# Patient Record
Sex: Male | Born: 1940 | Race: White | Hispanic: No | Smoking: Never smoker
Health system: Southern US, Community
[De-identification: ages and names within clinical notes are randomized; demographics above are authoritative.]

## PROBLEM LIST (undated history)

## (undated) DIAGNOSIS — I4891 Unspecified atrial fibrillation: Secondary | ICD-10-CM

## (undated) DIAGNOSIS — I639 Cerebral infarction, unspecified: Secondary | ICD-10-CM

## (undated) DIAGNOSIS — F0390 Unspecified dementia without behavioral disturbance: Secondary | ICD-10-CM

## (undated) DIAGNOSIS — I10 Essential (primary) hypertension: Secondary | ICD-10-CM

## (undated) DIAGNOSIS — K219 Gastro-esophageal reflux disease without esophagitis: Secondary | ICD-10-CM

## (undated) DIAGNOSIS — I499 Cardiac arrhythmia, unspecified: Secondary | ICD-10-CM

## (undated) DIAGNOSIS — E669 Obesity, unspecified: Secondary | ICD-10-CM

## (undated) DIAGNOSIS — E7800 Pure hypercholesterolemia, unspecified: Secondary | ICD-10-CM

## (undated) DIAGNOSIS — G4730 Sleep apnea, unspecified: Secondary | ICD-10-CM

## (undated) HISTORY — DX: Pure hypercholesterolemia, unspecified: E78.00

## (undated) HISTORY — PX: CATARACT EXTRACTION: SUR2

## (undated) HISTORY — PX: BRAIN SURGERY: SHX531

## (undated) HISTORY — DX: Essential (primary) hypertension: I10

## (undated) HISTORY — DX: Obesity, unspecified: E66.9

## (undated) HISTORY — DX: Sleep apnea, unspecified: G47.30

## (undated) HISTORY — DX: Cardiac arrhythmia, unspecified: I49.9

---

## 2009-07-25 ENCOUNTER — Encounter: Admission: RE | Admit: 2009-07-25 | Discharge: 2009-07-25 | Payer: Self-pay | Admitting: Cardiology

## 2009-08-01 ENCOUNTER — Inpatient Hospital Stay (HOSPITAL_BASED_OUTPATIENT_CLINIC_OR_DEPARTMENT_OTHER): Admission: RE | Admit: 2009-08-01 | Discharge: 2009-08-01 | Payer: Self-pay | Admitting: Cardiology

## 2009-08-15 ENCOUNTER — Encounter: Admission: RE | Admit: 2009-08-15 | Discharge: 2009-08-15 | Payer: Self-pay | Admitting: Cardiology

## 2010-09-04 ENCOUNTER — Other Ambulatory Visit: Payer: Self-pay | Admitting: Cardiology

## 2010-09-07 ENCOUNTER — Other Ambulatory Visit: Payer: Self-pay | Admitting: Cardiology

## 2010-09-07 DIAGNOSIS — I723 Aneurysm of iliac artery: Secondary | ICD-10-CM

## 2010-09-11 ENCOUNTER — Other Ambulatory Visit (INDEPENDENT_AMBULATORY_CARE_PROVIDER_SITE_OTHER): Payer: No Typology Code available for payment source

## 2010-09-11 DIAGNOSIS — E785 Hyperlipidemia, unspecified: Secondary | ICD-10-CM

## 2010-09-11 DIAGNOSIS — Z79899 Other long term (current) drug therapy: Secondary | ICD-10-CM

## 2010-09-11 DIAGNOSIS — I10 Essential (primary) hypertension: Secondary | ICD-10-CM

## 2010-09-12 ENCOUNTER — Ambulatory Visit
Admission: RE | Admit: 2010-09-12 | Discharge: 2010-09-12 | Disposition: A | Discharge status: Home / Self Care | Payer: No Typology Code available for payment source | Source: Ambulatory Visit | Attending: Cardiology | Admitting: Cardiology

## 2010-09-12 DIAGNOSIS — I723 Aneurysm of iliac artery: Secondary | ICD-10-CM

## 2010-09-12 MED ORDER — IOHEXOL 300 MG/ML  SOLN
125.0000 mL | Freq: Once | INTRAMUSCULAR | Status: AC | PRN
  Administered 2010-09-12: 125 mL via INTRAVENOUS

## 2011-11-06 ENCOUNTER — Encounter: Payer: Self-pay | Admitting: *Deleted

## 2013-12-06 ENCOUNTER — Encounter: Payer: Self-pay | Admitting: Cardiology

## 2014-03-07 DIAGNOSIS — E785 Hyperlipidemia, unspecified: Secondary | ICD-10-CM | POA: Insufficient documentation

## 2014-03-07 DIAGNOSIS — I10 Essential (primary) hypertension: Secondary | ICD-10-CM | POA: Insufficient documentation

## 2014-03-07 DIAGNOSIS — K219 Gastro-esophageal reflux disease without esophagitis: Secondary | ICD-10-CM | POA: Insufficient documentation

## 2016-03-20 ENCOUNTER — Emergency Department
Admission: EM | Admit: 2016-03-20 | Discharge: 2016-03-21 | Disposition: A | Discharge status: Home / Self Care | Payer: Medicare Other | Attending: Emergency Medicine | Admitting: Emergency Medicine

## 2016-03-20 ENCOUNTER — Emergency Department: Payer: Medicare Other

## 2016-03-20 DIAGNOSIS — E869 Volume depletion, unspecified: Secondary | ICD-10-CM | POA: Diagnosis not present

## 2016-03-20 DIAGNOSIS — I671 Cerebral aneurysm, nonruptured: Secondary | ICD-10-CM | POA: Diagnosis not present

## 2016-03-20 DIAGNOSIS — I729 Aneurysm of unspecified site: Secondary | ICD-10-CM

## 2016-03-20 DIAGNOSIS — I10 Essential (primary) hypertension: Secondary | ICD-10-CM | POA: Diagnosis not present

## 2016-03-20 DIAGNOSIS — R42 Dizziness and giddiness: Secondary | ICD-10-CM | POA: Diagnosis present

## 2016-03-20 LAB — URINALYSIS COMPLETE WITH MICROSCOPIC (ARMC ONLY)
Bacteria, UA: NONE SEEN
Bilirubin Urine: NEGATIVE
GLUCOSE, UA: NEGATIVE mg/dL
Hgb urine dipstick: NEGATIVE
KETONES UR: NEGATIVE mg/dL
Leukocytes, UA: NEGATIVE
Nitrite: NEGATIVE
PROTEIN: 100 mg/dL — AB
SPECIFIC GRAVITY, URINE: 1.021 (ref 1.005–1.030)
pH: 6 (ref 5.0–8.0)

## 2016-03-20 LAB — CBC
HCT: 43.5 % (ref 40.0–52.0)
Hemoglobin: 14.9 g/dL (ref 13.0–18.0)
MCH: 30.7 pg (ref 26.0–34.0)
MCHC: 34.2 g/dL (ref 32.0–36.0)
MCV: 89.9 fL (ref 80.0–100.0)
PLATELETS: 161 10*3/uL (ref 150–440)
RBC: 4.84 MIL/uL (ref 4.40–5.90)
RDW: 13.8 % (ref 11.5–14.5)
WBC: 7.2 10*3/uL (ref 3.8–10.6)

## 2016-03-20 LAB — BASIC METABOLIC PANEL
Anion gap: 8 (ref 5–15)
BUN: 27 mg/dL — AB (ref 6–20)
CALCIUM: 9.1 mg/dL (ref 8.9–10.3)
CHLORIDE: 104 mmol/L (ref 101–111)
CO2: 27 mmol/L (ref 22–32)
CREATININE: 1.42 mg/dL — AB (ref 0.61–1.24)
GFR calc non Af Amer: 47 mL/min — ABNORMAL LOW (ref >60)
GFR, EST AFRICAN AMERICAN: 54 mL/min — AB (ref >60)
GLUCOSE: 111 mg/dL — AB (ref 65–99)
Potassium: 4.5 mmol/L (ref 3.5–5.1)
Sodium: 139 mmol/L (ref 135–145)

## 2016-03-20 LAB — TROPONIN I: Troponin I: <0.03 ng/mL (ref <0.03)

## 2016-03-20 LAB — CK: Total CK: 95 U/L (ref 49–397)

## 2016-03-20 MED ORDER — IOPAMIDOL (ISOVUE-370) INJECTION 76%
75.0000 mL | Freq: Once | INTRAVENOUS | Status: AC | PRN
  Administered 2016-03-20: 75 mL via INTRAVENOUS

## 2016-03-20 MED ORDER — SODIUM CHLORIDE 0.9 % IV BOLUS (SEPSIS)
1000.0000 mL | Freq: Once | INTRAVENOUS | Status: AC
  Administered 2016-03-20: 1000 mL via INTRAVENOUS

## 2016-03-20 NOTE — ED Provider Notes (Signed)
[facility] Emergency Department Provider Note  ____________________________________________   First MD Initiated Contact with Patient 03/20/16 2012     (approximate)  I have reviewed the triage vital signs and the nursing notes.   HISTORY  Chief Complaint Dizziness; Blurred Vision; and Nausea    HPI Jose Baker is a 75 y.o. male who is generally healthy and active for his age and presents for evaluation of lightheadedness, some double vision, and nausea that occurred earlier today acutely after he had been working outside all day.  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  By the end of the afternoon he felt dizzy when he would turn his head to the sidesand he felt lightheaded when ambulating.  He also states that he was seeing double when he was looking at the gears and levers on his tractor.  All that has since resolved.  He denies fever/chills, chest pain, shortness of breath, and, abdominal pain.  He endorses a normal amount of urination.  He had nausea but no vomiting.  He states that his symptoms are moderate but now resolved.  Nothing in particular made it better nor worse.   Past Medical History:  Diagnosis Date  . Arrhythmia   . HTN (hypertension)   . Hypercholesteremia   . Obesity   . Sleep apnea     There are no active problems to display for this patient.   No past surgical history on file.  Prior to Admission medications   Medication Sig Start Date End Date Taking? Authorizing Provider  b complex vitamins tablet Take 1 tablet by mouth daily.    Historical Provider, MD  Calcium Carbonate-Vitamin D (CALCIUM 500 + D PO) Take by mouth daily.    Historical Provider, MD  Cholecalciferol (VITAMIN D3) 2000 UNITS TABS Take by mouth daily.    Historical Provider, MD  fish oil-omega-3 fatty acids 1000 MG capsule Take 2 g by mouth daily.    Historical  Provider, MD  Selenium 200 MCG CAPS Take by mouth daily.    Historical Provider, MD  verapamil (CALAN-SR) 180 MG CR tablet Take 180 mg by mouth at bedtime.    Historical Provider, MD    Allergies Lipitor [atorvastatin calcium]  Family History  Problem Relation Age of Onset  . Alzheimer's disease    . Hypertension      Social History Social History  Substance Use Topics  . Smoking status: Never Smoker  . Smokeless tobacco: Not on file  . Alcohol use No    Review of Systems Constitutional: No fever/chills Eyes: Transient double vision ENT: No sore throat. Cardiovascular: Denies chest pain. Respiratory: Denies shortness of breath. Gastrointestinal: No abdominal pain.  nausea, no vomiting.  No diarrhea.  No constipation. Genitourinary: Negative for dysuria. Musculoskeletal: Negative for back pain. Skin: Negative for rash. Neurological: Negative for headaches, focal weakness or numbness.  Lightheadedness and dizziness after working outside, now resolved  10-point ROS otherwise negative.  ____________________________________________   PHYSICAL EXAM:  VITAL SIGNS: ED Triage Vitals  Enc Vitals Group     BP 03/20/16 1830 (!) 153/89     Pulse Rate 03/20/16 1830 74     Resp 03/20/16 1830 18     Temp 03/20/16 1830 98 F (36.7 C)     Temp Source 03/20/16 1830 Oral     SpO2 03/20/16 1830 95 %  Weight 03/20/16 1831 220 lb (99.8 kg)     Height 03/20/16 1831 5\' 7"  (1.702 m)     Head Circumference --      Peak Flow --      Pain Score 03/20/16 1832 0     Pain Loc --      Pain Edu? --      Excl. in GC? --     Constitutional: Alert and oriented. Well appearing and in no acute distress. Eyes: Conjunctivae are normal. PERRL. EOMI. No nystagmus or reproducible double vision with ocular range of motion  Head: Atraumatic. Nose: No congestion/rhinnorhea. Mouth/Throat: Mucous membranes are moist.  Oropharynx non-erythematous. Neck: No stridor.  No meningeal signs.     Cardiovascular: Normal rate, regular rhythm. Good peripheral circulation. Grossly normal heart sounds. Respiratory: Normal respiratory effort.  No retractions. Lungs CTAB. Gastrointestinal: Soft and nontender. No distention.  Musculoskeletal: No lower extremity tenderness nor edema. No gross deformities of extremities. Neurologic:  Normal speech and language. No gross focal neurologic deficits are appreciated.  Skin:  Skin is warm, dry and intact. No rash noted. Psychiatric: Mood and affect are normal. Speech and behavior are normal.  ____________________________________________   LABS (all labs ordered are listed, but only abnormal results are displayed)  Labs Reviewed  BASIC METABOLIC PANEL - Abnormal; Notable for the following:       Result Value   Glucose, Bld 111 (*)    BUN 27 (*)    Creatinine, Ser 1.42 (*)    GFR calc non Af Amer 47 (*)    GFR calc Af Amer 54 (*)    All other components within normal limits  URINALYSIS COMPLETEWITH MICROSCOPIC ([facility] ONLY) - Abnormal; Notable for the following:    Color, Urine YELLOW (*)    APPearance CLEAR (*)    Protein, ur 100 (*)    Squamous Epithelial / LPF 0-5 (*)    All other components within normal limits  CBC  TROPONIN I  CK  CBG MONITORING, ED   ____________________________________________  EKG  ED ECG REPORT I, Olean Sangster, the attending physician, personally viewed and interpreted this ECG.  Date: 03/20/2016 EKG Time: 19:16 Rate: 55 Rhythm: Sinus bradycardia with sinus arrhythmia and first-degree heart block QRS Axis: normal Intervals: First-degree heart block with PR interval of 272 ms ST/T Wave abnormalities: normal Conduction Disturbances: none Narrative Interpretation: unremarkable  ____________________________________________  RADIOLOGY   Ct Angio Head W Or Wo Contrast  Result Date: 03/20/2016 CLINICAL DATA:  75 y/o M; dizziness and double vision. Question of aneurysm on CT. EXAM: CT ANGIOGRAPHY  HEAD TECHNIQUE: Multidetector CT imaging of the head was performed using the standard protocol during bolus administration of intravenous contrast. Multiplanar CT image reconstructions and MIPs were obtained to evaluate the vascular anatomy. CONTRAST:  75 cc Isovue 370 COMPARISON:  CT head 03/20/2016 FINDINGS: CTA HEAD Anterior circulation: Internal carotid arteries, middle cerebral arteries, and anterior cerebral arteries are patent. Mild calcific atherosclerosis of cavernous internal carotid arteries. No right A1 segment enlarged left A1 segment anterior communicating artery, normal variant. 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 Large patent bilateral posterior communicating arteries. Posterior circulation: Left dominant vertebrobasilar system. The right vertebral artery largely terminates in right plica. Atherosclerosis of the left V4 segment with areas of irregularity and mild stenosis. Patent small caliber basilar artery. Diminutive bilateral P1 segments. Patent bilateral posterior cerebral arteries. Venous sinuses: Poor  contrast opacification of the dural venous sinuses. Anatomic variants: Bilateral fetal posterior cerebral arteries. No right A1 segment with large left A1 segment providing circulation to bilateral ACA. IMPRESSION: 1. Anatomic variant with no right A1 segment enlarged left A1 segment providing circulation to bilateral A2 and distal ACA. 2. 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 3. Intracranial atherosclerosis without high-grade stenosis. No large vessel occlusion. No other aneurysm is identified. These results will be called to the ordering clinician or representative by the Radiologist Assistant, and communication documented in the PACS or zVision Dashboard. Electronically Signed   By: Mitzi HansenLance  Furusawa-Stratton M.D.   On: 03/20/2016 22:12   Ct Head Wo Contrast  Result  Date: 03/20/2016 CLINICAL DATA:  Dizziness and double vision after working outside all day today. Symptoms improved. EXAM: CT HEAD WITHOUT CONTRAST TECHNIQUE: Contiguous axial images were obtained from the base of the skull through the vertex without intravenous contrast. COMPARISON:  None. FINDINGS: Brain: Ventricles, cisterns and other CSF spaces are within normal. There is no mass, mass effect, shift of midline structures or acute hemorrhage. No evidence of acute infarction. Vascular: Possible aneurysm over the right side of the circle Willis in the region of the A1 segment or anterior communicating artery. Skull: Within normal. Sinuses/Orbits: Within normal. IMPRESSION: No acute intracranial findings. Possible aneurysm over the right side of the circle Willis. Recommend CTA of the head for further evaluation. Electronically Signed   By: Elberta Fortisaniel  Boyle M.D.   On: 03/20/2016 19:59    ____________________________________________   PROCEDURES  Procedure(s) performed:   Procedures   Critical Care performed: No ____________________________________________   INITIAL IMPRESSION / ASSESSMENT AND PLAN / ED COURSE  Pertinent labs & imaging results that were available during my care of the patient were reviewed by me and considered in my medical decision making (see chart for details).  Patient is well-appearing and in no acute distress. Non-con CT notable for possible aneurysm.  Doubt this has to do with the current symptoms, but radiology recommends we further evaluate it.  Discussed with patient and spouse who do want to pursue imaging.  Will order CTA as recommended.   (Note that documentation was delayed due to multiple ED patients requiring immediate care.)  9x8x589mm aneurysm on CTA.  Discussed outpatient options with patient, prefers [facility].  Will consult by phone.  Patient feels better after IV fluids.    Clinical Course  Comment By Time  I spoke by phone with [facility].  He agrees with the plan for outpatient follow up.  I gave two phone numbers to call to make an appointment.  Patient is currently asymptomatic and feels better after fluids. Labs reassuring. Loleta Roseory Dortha Neighbors, MD 08/23 2335    ____________________________________________  FINAL CLINICAL IMPRESSION(S) / ED DIAGNOSES  Final diagnoses:  Aneurysm (HCC)  Lightheadedness  Volume depletion     MEDICATIONS GIVEN DURING THIS VISIT:  Medications  sodium chloride 0.9 % bolus 1,000 mL (0 mLs Intravenous Stopped 03/20/16 2230)  iopamidol (ISOVUE-370) 76 % injection 75 mL (75 mLs Intravenous Contrast Given 03/20/16 2145)     NEW OUTPATIENT MEDICATIONS STARTED DURING THIS VISIT:  New Prescriptions   No medications on file      Note:  This document was prepared using Dragon voice recognition software and may include unintentional dictation errors.    Loleta Roseory Bertine Schlottman, MD 03/21/16 0000

## 2016-03-20 NOTE — ED Triage Notes (Signed)
Pt arrives from home with reports of dizziness and double vision as he came home today after he had been working outside all day  Pt reports that he feels better now  "I think it has all gone away."

## 2016-03-20 NOTE — Discharge Instructions (Signed)
You have been seen today in the Emergency Department (ED)  for lightheadedness, dizziness, and transient double vision.  Your workup including labs and EKG and imaging show reassuring results.  Your symptoms may be due to mild dehydration, so it is important that you drink plenty of non-alcoholic fluids.  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  It is in the [place] of [place], a structure that provides blood to your brain, and specifically is located at the trifurcation of bilateral A2 and left A1/anterior communicating artery segments.  We discussed the findings with Dr. Lynwood Dawley, a neurosurgeon at [facility], and he agrees that following up in the [facility] Neurosurgery clinic is a good plan.  You do not need any urgent or emergent intervention at this time.  Please bring the CD we provided to your clinic visit when you follow up.  Please call your regular doctor as soon as possible to schedule the next available clinic appointment to follow up with him/her regarding your visit to the ED and your symptoms.  Return to the Emergency Department (ED)  if you have any further syncopal episodes (pass out again) or develop ANY chest pain, pressure, tightness, trouble breathing, sudden sweating, or other symptoms that concern you.

## 2016-03-20 NOTE — ED Notes (Signed)
Results reviewed; troponin added to blood drawn; Charge nurse notified of pt's CT results; pt to be taken to room

## 2016-03-21 NOTE — ED Notes (Signed)
Pt given CD of imaging. Pt informed to take CD to neurosurgeon and family doctor.

## 2016-04-26 DIAGNOSIS — I671 Cerebral aneurysm, nonruptured: Secondary | ICD-10-CM | POA: Insufficient documentation

## 2016-08-12 DIAGNOSIS — M1990 Unspecified osteoarthritis, unspecified site: Secondary | ICD-10-CM | POA: Insufficient documentation

## 2016-08-12 DIAGNOSIS — G4733 Obstructive sleep apnea (adult) (pediatric): Secondary | ICD-10-CM | POA: Insufficient documentation

## 2016-08-13 DIAGNOSIS — I480 Paroxysmal atrial fibrillation: Secondary | ICD-10-CM | POA: Insufficient documentation

## 2017-07-04 ENCOUNTER — Encounter: Payer: Self-pay | Admitting: Urology

## 2017-07-04 ENCOUNTER — Ambulatory Visit (INDEPENDENT_AMBULATORY_CARE_PROVIDER_SITE_OTHER): Payer: Medicare Other | Admitting: Urology

## 2017-07-04 VITALS — BP 163/99 | HR 56 | Ht 67.0 in | Wt 218.4 lb

## 2017-07-04 DIAGNOSIS — N138 Other obstructive and reflux uropathy: Secondary | ICD-10-CM

## 2017-07-04 DIAGNOSIS — N401 Enlarged prostate with lower urinary tract symptoms: Secondary | ICD-10-CM

## 2017-07-04 DIAGNOSIS — R972 Elevated prostate specific antigen [PSA]: Secondary | ICD-10-CM | POA: Diagnosis not present

## 2017-07-04 LAB — BLADDER SCAN AMB NON-IMAGING: SCAN RESULT: 62

## 2017-07-04 MED ORDER — TAMSULOSIN HCL 0.4 MG PO CAPS: 0.4000 mg | ORAL_CAPSULE | Freq: Every day | ORAL | 11 refills | Status: DC

## 2017-07-04 NOTE — Progress Notes (Signed)
07/04/2017 12:58 PM   Jose Baker 1940/08/15 [id number]  Referring provider: Marina Goodell, MD [address] [place], [place] [address]  Chief Complaint  Patient presents with  . Elevated PSA    HPI: 76 year old male referred for further evaluation of elevated PSA.  His PSA was checked in 05/2017 by his PCP as a routine lab draw.  At this time, his PSA was 4.20.  Prior to this, it was 3.23 on 04/2016.  We have no previous values before 2017.  He does have baseline urinary symptoms.  He does have a day and nighttime frequency, sensation of incomplete bladder emptying, and weak stream.  He currently takes no medications for his prostate.  IPSS as below.  PVR 62 cc.    Reports that he has been seen in the remote past by urologist for urinary symptoms.  He does not recall ever being on medications.  No known previous history of elevated PSA.  No previous prostate biopsies.  No weight loss or bone pain.,  History of prostate cancer.  IPSS    Row Name 07/04/17 0900         International Prostate Symptom Score   How often have you had the sensation of not emptying your bladder?  Less than half the time     How often have you had to urinate less than every two hours?  About half the time     How often have you found you stopped and started again several times when you urinated?  Less than 1 in 5 times     How often have you found it difficult to postpone urination?  Almost always     How often have you had a weak urinary stream?  About half the time     How often have you had to strain to start urination?  Not at All     How many times did you typically get up at night to urinate?  3 Times     Total IPSS Score  17       Quality of Life due to urinary symptoms   If you were to spend the rest of your life with your urinary condition just the way it is now how would you feel about that?  Mixed        Score:  1-7 Mild 8-19 Moderate 20-35 Severe    PMH: Past Medical  History:  Diagnosis Date  . Arrhythmia   . HTN (hypertension)   . Hypercholesteremia   . Obesity   . Sleep apnea     Surgical History: Past Surgical History:  Procedure Laterality Date  . BRAIN SURGERY    . CATARACT EXTRACTION      Home Medications:  Allergies as of 07/04/2017      Reactions   Lipitor [atorvastatin Calcium]       Medication List        Accurate as of 07/04/17 12:58 PM. Always use your most recent med list.          amLODipine 2.5 MG tablet Commonly known as:  NORVASC Take 2.5 mg by mouth daily.   b complex vitamins tablet Take 1 tablet by mouth daily.   CALCIUM 500 + D PO Take by mouth daily.   fish oil-omega-3 fatty acids 1000 MG capsule Take 2 g by mouth daily.   metoprolol succinate 100 MG 24 hr tablet Commonly known as:  TOPROL-XL Take 100 mg by mouth daily.   omeprazole  20 MG capsule Commonly known as:  PRILOSEC TAKE 1 CAPSULE (20 MG TOTAL) BY MOUTH ONCE DAILY.   Selenium 200 MCG Caps Take by mouth daily.   tamsulosin 0.4 MG Caps capsule Commonly known as:  FLOMAX Take 1 capsule (0.4 mg total) by mouth daily.   verapamil 180 MG CR tablet Commonly known as:  CALAN-SR Take 180 mg by mouth at bedtime.   Vitamin D3 2000 units Tabs Take by mouth daily.       Allergies:  Allergies  Allergen Reactions  . Lipitor [Atorvastatin Calcium]     Family History: Family History  Problem Relation Age of Onset  . Alzheimer's disease Unknown   . Hypertension Unknown   . Prostate cancer Neg Hx   . Bladder Cancer Neg Hx   . Kidney cancer Neg Hx     Social History:  reports that  has never smoked. he has never used smokeless tobacco. He reports that he does not drink alcohol or use drugs.  ROS: UROLOGY Frequent Urination?: Yes Hard to postpone urination?: Yes Burning/pain with urination?: No Get up at night to urinate?: Yes Leakage of urine?: No Urine stream starts and stops?: No Trouble starting stream?: No Do you have to  strain to urinate?: No Blood in urine?: No Urinary tract infection?: No Sexually transmitted disease?: No Injury to kidneys or bladder?: No Painful intercourse?: No Weak stream?: No Erection problems?: No Penile pain?: No  Gastrointestinal Nausea?: No Vomiting?: No Indigestion/heartburn?: No Diarrhea?: No Constipation?: No  Constitutional Fever: No Night sweats?: No Weight loss?: No Fatigue?: Yes  Skin Skin rash/lesions?: No Itching?: No  Eyes Blurred vision?: Yes Double vision?: No  Ears/Nose/Throat Sore throat?: No Sinus problems?: No  Hematologic/Lymphatic Swollen glands?: No Easy bruising?: Yes  Cardiovascular Leg swelling?: No Chest pain?: Yes  Respiratory Cough?: No Shortness of breath?: No  Endocrine Excessive thirst?: No  Musculoskeletal Back pain?: No Joint pain?: Yes  Neurological Headaches?: No Dizziness?: Yes  Psychologic Depression?: No Anxiety?: No  Physical Exam: BP (!) 163/99 (BP Location: Right Arm, Patient Position: Sitting, Cuff Size: Normal)   Pulse (!) 56   Ht 5\' 7"  (1.702 m)   Wt 218 lb 6.4 oz (99.1 kg)   BMI 34.21 kg/m   Constitutional:  Alert and oriented, No acute distress. HEENT: [place] AT, moist mucus membranes.  Trachea midline, no masses. Cardiovascular: No clubbing, cyanosis, or edema. Respiratory: Normal respiratory effort, no increased work of breathing. GI: Abdomen is soft, nontender, nondistended, no abdominal masses.  Obese.   GU: No CVA tenderness.  Rectal: Enlarged gland, 50 cc, rubbery with slight asymmetry R>L Skin: No rashes, bruises or suspicious lesions. Neurologic: Grossly intact, no focal deficits, moving all 4 extremities. Psychiatric: Normal mood and affect.  Laboratory Data: PSA as above Hemoglobin 14.9 on 10/17 Creatinine 1.3 on 11/18  Urinalysis Urinalysis from 05/14/2017 reviewed.  No evidence of microscopic blood or infection.   Pertinent Imaging: Results for orders placed or  performed in visit on 07/04/17  Bladder Scan (Post Void Residual) in office  Result Value Ref Range   Scan Result 62     Assessment & Plan:    1. Elevated PSA  We reviewed the implications of an elevated PSA and the uncertainty surrounding it. In general, a man's PSA increases with age and is produced by both normal and cancerous prostate tissue. Differential for elevated PSA is BPH, prostate cancer, infection, recent intercourse/ejaculation, prostate infarction, recent urethroscopic manipulation (foley placement/cystoscopy) and prostatitis. Management of an elevated  PSA can include observation or prostate biopsy and wediscussed this in detail. We discussed that indications for prostate biopsy are defined by age and race specific PSA cutoffs as well as a PSA velocity of 0.75/year.  Given his age and comorbidities, would prefer to follow at this point in time.  Additionally, PSA is somewhat in the range as expected based on gland size.  Notably, there has been a small rise over the past year.  Rectal exam without nodules, slight asymmetry but not particularly pathologic.  Recommend follow-up in 6 months with PSA/DRE, will continue to trend and follow.  - Bladder Scan (Post Void Residual) in office  2. BPH with obstruction/lower urinary tract symptoms Trial of flomax  Effects of this medication discussed   Return in about 6 months (around 01/02/2018) for PSA (prior to visit), IPSS, PVR.  Vanna ScotlandAshley Pennye Beeghly, MD  [facility] [address], [address] [place], [place] [address] [phone]

## 2017-07-08 ENCOUNTER — Ambulatory Visit: Payer: Self-pay | Admitting: Urology

## 2017-12-04 DIAGNOSIS — N183 Chronic kidney disease, stage 3 unspecified: Secondary | ICD-10-CM | POA: Insufficient documentation

## 2017-12-04 DIAGNOSIS — N400 Enlarged prostate without lower urinary tract symptoms: Secondary | ICD-10-CM | POA: Insufficient documentation

## 2017-12-30 ENCOUNTER — Other Ambulatory Visit: Payer: Self-pay | Admitting: Family Medicine

## 2017-12-30 DIAGNOSIS — R972 Elevated prostate specific antigen [PSA]: Secondary | ICD-10-CM

## 2018-01-02 ENCOUNTER — Other Ambulatory Visit: Payer: Medicare Other

## 2018-01-02 DIAGNOSIS — R972 Elevated prostate specific antigen [PSA]: Secondary | ICD-10-CM

## 2018-01-03 LAB — PSA: Prostate Specific Ag, Serum: 4.4 ng/mL — ABNORMAL HIGH (ref 0.0–4.0)

## 2018-01-07 ENCOUNTER — Encounter: Payer: Self-pay | Admitting: Urology

## 2018-01-07 ENCOUNTER — Ambulatory Visit (INDEPENDENT_AMBULATORY_CARE_PROVIDER_SITE_OTHER): Payer: Medicare Other | Admitting: Urology

## 2018-01-07 VITALS — BP 156/83 | HR 55 | Resp 16 | Ht 66.0 in | Wt 221.6 lb

## 2018-01-07 DIAGNOSIS — N401 Enlarged prostate with lower urinary tract symptoms: Secondary | ICD-10-CM

## 2018-01-07 DIAGNOSIS — N138 Other obstructive and reflux uropathy: Secondary | ICD-10-CM | POA: Diagnosis not present

## 2018-01-07 DIAGNOSIS — R972 Elevated prostate specific antigen [PSA]: Secondary | ICD-10-CM

## 2018-01-07 LAB — BLADDER SCAN AMB NON-IMAGING: Scan Result: 85

## 2018-01-07 MED ORDER — FINASTERIDE 5 MG PO TABS: 5.0000 mg | ORAL_TABLET | Freq: Every day | ORAL | 11 refills | Status: DC

## 2018-01-07 NOTE — Progress Notes (Signed)
01/07/2018 4:46 PM   [name] October 23, 1940 [id number]  Referring provider: Marina Goodell, MD [address] [place], [place] [address]  Chief Complaint  Patient presents with  . Elevated PSA    HPI: 77 year old male who returns today for routine six-month follow-up.  He is in follow-up for elevated PSA as well as BPH with urinary symptoms.  PSA trend below.  Most recent PSA 4.4.  Rectal exam last visit enlarged, 50 cc prostate rubbery with slight asymmetry, right greater than left.  No induration or nodules appreciated.  He does have baseline urinary symptoms.   IPSS today as below.  He notes no improvement, possibly even worsening of his urinary symptoms.  He is most bothered by his obstructive voiding symptoms including difficulty starting a stream, slow stream, and sensation of incomplete bladder emptying.    He was given Flomax last visit which made no difference in his urinary symptoms.   He is not particularly interested in surgery.  PSA trend: 4.4 01/02/2018 4.20 06/02/2017 3.29 05/23/2016   IPSS    Row Name 01/07/18 0800         International Prostate Symptom Score   How often have you had the sensation of not emptying your bladder?  About half the time     How often have you had to urinate less than every two hours?  About half the time     How often have you found you stopped and started again several times when you urinated?  Less than half the time     How often have you found it difficult to postpone urination?  About half the time     How often have you had a weak urinary stream?  About half the time     How often have you had to strain to start urination?  About half the time     How many times did you typically get up at night to urinate?  3 Times     Total IPSS Score  20       Quality of Life due to urinary symptoms   If you were to spend the rest of your life with your urinary condition just the way it is now how would you feel about that?  Mixed          Score:  1-7 Mild 8-19 Moderate 20-35 Severe   PMH: Past Medical History:  Diagnosis Date  . Arrhythmia   . HTN (hypertension)   . Hypercholesteremia   . Obesity   . Sleep apnea     Surgical History: Past Surgical History:  Procedure Laterality Date  . BRAIN SURGERY    . CATARACT EXTRACTION      Home Medications:  Allergies as of 01/07/2018      Reactions   Lipitor [atorvastatin Calcium]       Medication List        Accurate as of 01/07/18  4:46 PM. Always use your most recent med list.          amLODipine 2.5 MG tablet Commonly known as:  NORVASC Take 2.5 mg by mouth daily.   b complex vitamins tablet Take 1 tablet by mouth daily.   CALCIUM 500 + D PO Take by mouth daily.   finasteride 5 MG tablet Commonly known as:  PROSCAR Take 1 tablet (5 mg total) by mouth daily.   fish oil-omega-3 fatty acids 1000 MG capsule Take 2 g by mouth daily.   Melatonin  3 MG Tabs Take by mouth.   metoprolol succinate 100 MG 24 hr tablet Commonly known as:  TOPROL-XL Take 100 mg by mouth daily.   omeprazole 20 MG capsule Commonly known as:  PRILOSEC TAKE 1 CAPSULE (20 MG TOTAL) BY MOUTH ONCE DAILY.   QUEtiapine 50 MG tablet Commonly known as:  SEROQUEL Take by mouth.   Selenium 200 MCG Caps Take by mouth daily.   verapamil 180 MG CR tablet Commonly known as:  CALAN-SR Take 180 mg by mouth at bedtime.   Vitamin D3 2000 units Tabs Take by mouth daily.       Allergies:  Allergies  Allergen Reactions  . Lipitor [Atorvastatin Calcium]     Family History: Family History  Problem Relation Age of Onset  . Alzheimer's disease Unknown   . Hypertension Unknown   . Prostate cancer Neg Hx   . Bladder Cancer Neg Hx   . Kidney cancer Neg Hx     Social History:  reports that he has never smoked. He has never used smokeless tobacco. He reports that he does not drink alcohol or use drugs.  ROS: UROLOGY Frequent Urination?: No Hard to postpone  urination?: No Burning/pain with urination?: No Get up at night to urinate?: No Leakage of urine?: No Urine stream starts and stops?: No Trouble starting stream?: No Do you have to strain to urinate?: No Blood in urine?: No Urinary tract infection?: No Sexually transmitted disease?: No Injury to kidneys or bladder?: No Painful intercourse?: No Weak stream?: No Erection problems?: No Penile pain?: No  Gastrointestinal Nausea?: No Vomiting?: No Indigestion/heartburn?: No Diarrhea?: No Constipation?: No  Constitutional Fever: No Night sweats?: No Weight loss?: No Fatigue?: No  Skin Skin rash/lesions?: No Itching?: No  Eyes Blurred vision?: No Double vision?: No  Ears/Nose/Throat Sore throat?: No Sinus problems?: No  Hematologic/Lymphatic Swollen glands?: No Easy bruising?: No  Cardiovascular Leg swelling?: No Chest pain?: No  Respiratory Cough?: No Shortness of breath?: No  Endocrine Excessive thirst?: No  Musculoskeletal Back pain?: Yes Joint pain?: Yes  Neurological Headaches?: No Dizziness?: No  Psychologic Depression?: Yes Anxiety?: Yes  Physical Exam: BP (!) 156/83   Pulse (!) 55   Resp 16   Ht 5\' 6"  (1.676 m)   Wt 221 lb 9.6 oz (100.5 kg)   SpO2 96%   BMI 35.77 kg/m   Constitutional:  Alert and oriented, No acute distress. HEENT: [place] AT, moist mucus membranes.  Trachea midline, no masses. Cardiovascular: No clubbing, cyanosis, or edema. Respiratory: Normal respiratory effort, no increased work of breathing. Skin: No rashes, bruises or suspicious lesions. Neurologic: Grossly intact, no focal deficits, moving all 4 extremities. Psychiatric: Normal mood and affect.  Laboratory Data: Lab Results  Component Value Date   WBC 7.2 03/20/2016   HGB 14.9 03/20/2016   HCT 43.5 03/20/2016   MCV 89.9 03/20/2016   PLT 161 03/20/2016    Lab Results  Component Value Date   CREATININE 1.42 (H) 03/20/2016     Urinalysis N/a  Pertinent Imaging: PVR 85 cc  Assessment & Plan:    1. BPH with obstruction/lower urinary tract symptoms Refractory urinary symptoms, failed Flomax (stop this med) Not interested in surgical intervention Given the size of his prostate on rectal exam, will start patient on finasteride  He understands that this medication takes several months to reduce maximal effect Reassess in 6 months - BLADDER SCAN AMB NON-IMAGING - PSA; Future  2. Elevated PSA PSA essentially stable for the past 6  months We will recheck in 6 months again No indication for biopsy at this time PSA should drop with finasteride, if fails to would consider prostate biopsy at that point - PSA; Future   Return in about 6 months (around 07/09/2018) for IPSS/ PVR/ PSA (prior).  Vanna Scotland, MD  [facility] [address], [address] [place], [place] [address] [phone]

## 2018-07-07 ENCOUNTER — Other Ambulatory Visit: Payer: Self-pay

## 2018-07-07 DIAGNOSIS — N138 Other obstructive and reflux uropathy: Secondary | ICD-10-CM

## 2018-07-07 DIAGNOSIS — N401 Enlarged prostate with lower urinary tract symptoms: Principal | ICD-10-CM

## 2018-07-07 MED ORDER — TAMSULOSIN HCL 0.4 MG PO CAPS: 0.4000 mg | ORAL_CAPSULE | Freq: Every day | ORAL | 11 refills | Status: DC

## 2018-07-31 ENCOUNTER — Other Ambulatory Visit: Payer: Medicare Other

## 2018-07-31 ENCOUNTER — Encounter: Payer: Self-pay | Admitting: Urology

## 2018-08-04 ENCOUNTER — Ambulatory Visit: Payer: Medicare Other | Admitting: Urology

## 2018-08-25 ENCOUNTER — Other Ambulatory Visit: Payer: Medicare Other

## 2018-08-25 DIAGNOSIS — N138 Other obstructive and reflux uropathy: Secondary | ICD-10-CM

## 2018-08-25 DIAGNOSIS — N401 Enlarged prostate with lower urinary tract symptoms: Principal | ICD-10-CM

## 2018-08-25 DIAGNOSIS — R972 Elevated prostate specific antigen [PSA]: Secondary | ICD-10-CM

## 2018-08-26 ENCOUNTER — Encounter: Payer: Self-pay | Admitting: Urology

## 2018-08-26 ENCOUNTER — Ambulatory Visit: Payer: Medicare Other | Admitting: Urology

## 2018-08-26 VITALS — BP 182/92 | HR 59 | Ht 67.0 in | Wt 220.0 lb

## 2018-08-26 DIAGNOSIS — N138 Other obstructive and reflux uropathy: Secondary | ICD-10-CM | POA: Diagnosis not present

## 2018-08-26 DIAGNOSIS — R972 Elevated prostate specific antigen [PSA]: Secondary | ICD-10-CM | POA: Diagnosis not present

## 2018-08-26 DIAGNOSIS — N401 Enlarged prostate with lower urinary tract symptoms: Secondary | ICD-10-CM

## 2018-08-26 LAB — PSA: PROSTATE SPECIFIC AG, SERUM: 4.2 ng/mL — AB (ref 0.0–4.0)

## 2018-08-26 LAB — BLADDER SCAN AMB NON-IMAGING

## 2018-08-26 NOTE — Progress Notes (Signed)
08/26/2018 8:48 AM   [name] [birth year] [id number]  Referring provider: Marina Goodell, MD [address] [place], [place] [address]  Chief Complaint  Patient presents with  . Jose Baker    HPI: 78 year old male follow-up with repeat PSA.  He was seen 12/2017.  PSA trend as below.  PSA collected yesterday is stable at 4.2.  Rectal exam at last visit showed a 50 g rubbery prostate which was slightly asymmetric but no nodules or suspicious lesions.  He does also have a personal history of BPH and lower urinary tract symptoms including history of incomplete bladder emptying weak stream.  He is tried Flomax with no significant benefit in his urinary symptoms.  At last visit, he was prescribed finasteride in the setting of ongoing urinary symptoms.  He took only took two tablets but felt like it hurt his belly.  He cannot recall exact details of why stopped it but just did not like the medication.  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  He reports he is having some memory issues.  PSA trend: 4.2  08/25/2018 4.4 01/02/2018 4.20 06/02/2017 3.29 05/23/2016  IPSS    Row Name 08/26/18 0800         International Prostate Symptom Score   How often have you had the sensation of not emptying your bladder?  About half the time     How often have you had to urinate less than every two hours?  Less than half the time     How often have you found you stopped and started again several times when you urinated?  Less than 1 in 5 times     How often have you found it difficult to postpone urination?  About half the time     How often have you had a weak urinary stream?  Less than 1 in 5 times     How often have you had to strain to start urination?  Less than 1 in 5 times     How many times did you typically get up at night to urinate?  1 Time     Total IPSS Score  12       Quality of Life due to urinary symptoms   If you were to spend the rest of your life with  your urinary condition just the way it is now how would you feel about that?  Mostly Disatisfied        Score:  1-7 Mild 8-19 Moderate 20-35 Severe    PMH: Past Medical History:  Diagnosis Date  . Arrhythmia   . HTN (hypertension)   . Hypercholesteremia   . Obesity   . Sleep apnea     Surgical History: Past Surgical History:  Procedure Laterality Date  . BRAIN SURGERY    . CATARACT EXTRACTION      Home Medications:  Allergies as of 08/26/2018      Reactions   Lipitor [atorvastatin Calcium]       Medication List       Accurate as of August 26, 2018  8:48 AM. Always use your most recent med list.        amLODipine 2.5 MG tablet Commonly known as:  NORVASC Take 2.5 mg by mouth daily.   b complex vitamins tablet Take 1 tablet by mouth daily.   CALCIUM 500 + D PO Take by mouth daily.   finasteride 5 MG tablet Commonly known as:  PROSCAR Take 1  tablet (5 mg total) by mouth daily.   fish oil-omega-3 fatty acids 1000 MG capsule Take 2 g by mouth daily.   Melatonin 3 MG Tabs Take by mouth.   metoprolol succinate 100 MG 24 hr tablet Commonly known as:  TOPROL-XL Take 100 mg by mouth daily.   omeprazole 20 MG capsule Commonly known as:  PRILOSEC TAKE 1 CAPSULE (20 MG TOTAL) BY MOUTH ONCE DAILY.   QUEtiapine 50 MG tablet Commonly known as:  SEROQUEL Take by mouth.   Selenium 200 MCG Caps Take by mouth daily.   verapamil 180 MG CR tablet Commonly known as:  CALAN-SR Take 180 mg by mouth at bedtime.   Vitamin D3 50 MCG (2000 UT) Tabs Take by mouth daily.       Allergies:  Allergies  Allergen Reactions  . Lipitor [Atorvastatin Calcium]     Family History: Family History  Problem Relation Age of Onset  . Alzheimer's disease Unknown   . Hypertension Unknown   . Prostate cancer Neg Hx   . Bladder Cancer Neg Hx   . Kidney cancer Neg Hx     Social History:  reports that he has never smoked. He has never used smokeless tobacco. He  reports that he does not drink alcohol or use drugs.  ROS: UROLOGY Frequent Urination?: No Hard to postpone urination?: No Burning/pain with urination?: No Get up at night to urinate?: No Leakage of urine?: No Urine stream starts and stops?: No Trouble starting stream?: No Do you have to strain to urinate?: No Blood in urine?: No Urinary tract infection?: No Sexually transmitted disease?: No Injury to kidneys or bladder?: No Painful intercourse?: No Weak stream?: No Erection problems?: No Penile pain?: No  Gastrointestinal Nausea?: No Vomiting?: No Indigestion/heartburn?: No Diarrhea?: No Constipation?: No  Constitutional Fever: No Night sweats?: No Weight loss?: No Fatigue?: No  Skin Skin rash/lesions?: No Itching?: No  Eyes Blurred vision?: No Double vision?: No  Ears/Nose/Throat Sore throat?: No Sinus problems?: No  Hematologic/Lymphatic Swollen glands?: No Easy bruising?: No  Cardiovascular Leg swelling?: No Chest pain?: No  Respiratory Cough?: No Shortness of breath?: No  Endocrine Excessive thirst?: No  Musculoskeletal Back pain?: No Joint pain?: No  Neurological Headaches?: No Dizziness?: No  Psychologic Depression?: No Anxiety?: No  Physical Exam: BP (!) 182/92 (BP Location: Left Arm, Patient Position: Sitting, Cuff Size: Large)   Pulse (!) 59   Ht 5\' 7"  (1.702 m)   Wt 220 lb (99.8 kg) Comment: per patient  BMI 34.46 kg/m   Constitutional:  Alert and oriented, No acute distress. HEENT: [place] AT, moist mucus membranes.  Trachea midline, no masses. Cardiovascular: No clubbing, cyanosis, or edema. Respiratory: Normal respiratory effort, no increased work of breathing. GI: Abdomen is soft, nontender, obese. Skin: No rashes, bruises or suspicious lesions. Neurologic: Grossly intact, no focal deficits, moving all 4 extremities. Psychiatric: Normal mood and affect.  Laboratory Data: Lab Results  Component Value Date   WBC 7.2  03/20/2016   HGB 14.9 03/20/2016   HCT 43.5 03/20/2016   MCV 89.9 03/20/2016   PLT 161 03/20/2016    Lab Results  Component Value Date   CREATININE 1.42 (H) 03/20/2016    PSA as above  Pertinent Imaging: Results for orders placed or performed in visit on 08/26/18  BLADDER SCAN AMB NON-IMAGING  Result Value Ref Range   Scan Result 46ml     Assessment & Plan:    1. BPH with obstruction/lower urinary tract symptoms Continues to  have refractory symptoms currently on no meds We discussed that it is uncommon for finasteride to cause GI distress, suspect that this was incidental We discussed that options for his urinary symptoms would include surgery, retry of the medications, or essentially living with the symptoms He will try finasteride again and let us know if he has any issues  - BLADDER SCAN AMB NON-IMAGING  2. Elevated PSA PSA stable and likely appropriate for his age Minimal concern for prostate cancer, if he starts finasteride, will recheck next year to ensure that he has a adequate drop - PSA; Future   Return in about 1 year (around 08/27/2019) for PSA/ DRE/ IPSS/ PVR.  Vanna Scotland, MD  [facility] [address], [address] [place], [place] [address] [phone]

## 2018-08-26 NOTE — Patient Instructions (Signed)
Please try Finasteride (Proscar) again

## 2019-08-27 ENCOUNTER — Other Ambulatory Visit: Payer: Medicare Other

## 2019-08-27 ENCOUNTER — Encounter: Payer: Self-pay | Admitting: Urology

## 2019-09-01 ENCOUNTER — Ambulatory Visit: Payer: Medicare Other | Admitting: Urology

## 2019-09-01 DIAGNOSIS — I351 Nonrheumatic aortic (valve) insufficiency: Secondary | ICD-10-CM | POA: Insufficient documentation

## 2019-09-03 ENCOUNTER — Other Ambulatory Visit
Admission: RE | Admit: 2019-09-03 | Discharge: 2019-09-03 | Disposition: A | Discharge status: Home / Self Care | Payer: Medicare Other | Attending: Urology | Admitting: Urology

## 2019-09-03 ENCOUNTER — Ambulatory Visit: Payer: Medicare Other | Admitting: Urology

## 2019-09-03 ENCOUNTER — Other Ambulatory Visit: Payer: Self-pay

## 2019-09-03 ENCOUNTER — Encounter: Payer: Self-pay | Admitting: Urology

## 2019-09-03 VITALS — BP 157/89 | HR 55 | Ht 65.0 in | Wt 225.0 lb

## 2019-09-03 DIAGNOSIS — N138 Other obstructive and reflux uropathy: Secondary | ICD-10-CM | POA: Diagnosis not present

## 2019-09-03 DIAGNOSIS — R972 Elevated prostate specific antigen [PSA]: Secondary | ICD-10-CM | POA: Diagnosis present

## 2019-09-03 DIAGNOSIS — N401 Enlarged prostate with lower urinary tract symptoms: Secondary | ICD-10-CM

## 2019-09-03 DIAGNOSIS — R339 Retention of urine, unspecified: Secondary | ICD-10-CM | POA: Diagnosis not present

## 2019-09-03 LAB — PSA: Prostatic Specific Antigen: 4.42 ng/mL — ABNORMAL HIGH (ref 0.00–4.00)

## 2019-09-03 LAB — BLADDER SCAN AMB NON-IMAGING

## 2019-09-03 MED ORDER — FINASTERIDE 5 MG PO TABS: 5.0000 mg | ORAL_TABLET | Freq: Every day | ORAL | 11 refills | Status: AC

## 2019-09-03 NOTE — Progress Notes (Signed)
09/03/2019 2:52 PM   Jose Baker [birth year] [id number]  Referring provider: Marina Goodell, MD [address] [place],  [place] [address]  Chief Complaint  Patient presents with  . Benign Prostatic Hypertrophy    1year    HPI: 79 year old male who presents today for routine annual follow-up.  His last seen on 08/17/2018.  He has a personal history of elevated PSA.  Please see details below.  He did not have his PSA drawn prior to today's appointment.   He has a personal history of BPH.  He is previous on Flomax and prescribed finasteride and addition.  He has been prescribed this now on several occasions and never actually started taking the medication.  He reports that he is having trouble sleeping but is not related to urinary symptoms.  He occasionally has difficulty starting a stream and emptying.  He feels like he "could be doing better".  IPSS as below.  Has not had any urinary tract infections or gross hematuria.  PSA trend: 4.2  08/25/2018 4.4 01/02/2018 4.20 06/02/2017 3.29 05/23/2016  IPSS    Row Name 09/03/19 1400         International Prostate Symptom Score   How often have you had the sensation of not emptying your bladder?  Less than 1 in 5     How often have you had to urinate less than every two hours?  About half the time     How often have you found you stopped and started again several times when you urinated?  Less than half the time     How often have you found it difficult to postpone urination?  Less than 1 in 5 times     How often have you had a weak urinary stream?  Less than half the time     How often have you had to strain to start urination?  Less than half the time     How many times did you typically get up at night to urinate?  2 Times     Total IPSS Score  13       Quality of Life due to urinary symptoms   If you were to spend the rest of your life with your urinary condition just the way it is now how would you feel about that?  Mostly  Disatisfied        Score:  1-7 Mild 8-19 Moderate 20-35 Severe    PMH: Past Medical History:  Diagnosis Date  . Arrhythmia   . HTN (hypertension)   . Hypercholesteremia   . Obesity   . Sleep apnea     Surgical History: Past Surgical History:  Procedure Laterality Date  . BRAIN SURGERY    . CATARACT EXTRACTION      Home Medications:  Allergies as of 09/03/2019      Reactions   Lipitor [atorvastatin Calcium]       Medication List       Accurate as of September 03, 2019  2:52 PM. If you have any questions, ask your nurse or doctor.        amLODipine 2.5 MG tablet Commonly known as: NORVASC Take 2.5 mg by mouth daily.   b complex vitamins tablet Take 1 tablet by mouth daily.   CALCIUM 500 + D PO Take by mouth daily.   finasteride 5 MG tablet Commonly known as: PROSCAR Take 1 tablet (5 mg total) by mouth daily.   fish oil-omega-3 fatty acids  1000 MG capsule Take 2 g by mouth daily.   lisinopril 5 MG tablet Commonly known as: ZESTRIL Take 5 mg by mouth daily.   Melatonin 3 MG Tabs Take by mouth.   metoprolol succinate 100 MG 24 hr tablet Commonly known as: TOPROL-XL Take 100 mg by mouth daily.   omeprazole 20 MG capsule Commonly known as: PRILOSEC TAKE 1 CAPSULE (20 MG TOTAL) BY MOUTH ONCE DAILY.   QUEtiapine 50 MG tablet Commonly known as: SEROQUEL Take by mouth.   Selenium 200 MCG Caps Take by mouth daily.   verapamil 180 MG CR tablet Commonly known as: CALAN-SR Take 180 mg by mouth at bedtime.   Vitamin D3 50 MCG (2000 UT) Tabs Take by mouth daily.       Allergies:  Allergies  Allergen Reactions  . Lipitor [Atorvastatin Calcium]     Family History: Family History  Problem Relation Age of Onset  . Alzheimer's disease Unknown   . Hypertension Unknown   . Prostate cancer Neg Hx   . Bladder Cancer Neg Hx   . Kidney cancer Neg Hx     Social History:  reports that he has never smoked. He has never used smokeless tobacco. He  reports that he does not drink alcohol or use drugs.  ROS: UROLOGY Frequent Urination?: No Hard to postpone urination?: No Burning/pain with urination?: No Get up at night to urinate?: Yes Leakage of urine?: Yes Urine stream starts and stops?: No Trouble starting stream?: No Do you have to strain to urinate?: No Blood in urine?: No Urinary tract infection?: No Sexually transmitted disease?: No Injury to kidneys or bladder?: No Painful intercourse?: No Weak stream?: No Erection problems?: No Penile pain?: No  Gastrointestinal Nausea?: No Vomiting?: No Indigestion/heartburn?: No Diarrhea?: No Constipation?: No  Constitutional Fever: No Night sweats?: No Weight loss?: No Fatigue?: No  Skin Skin rash/lesions?: No Itching?: No  Eyes Blurred vision?: No Double vision?: No  Ears/Nose/Throat Sore throat?: No Sinus problems?: No  Hematologic/Lymphatic Swollen glands?: No Easy bruising?: No  Cardiovascular Leg swelling?: No Chest pain?: No  Respiratory Cough?: Yes Shortness of breath?: No  Endocrine Excessive thirst?: No  Musculoskeletal Back pain?: No Joint pain?: No  Neurological Headaches?: No Dizziness?: No  Psychologic Depression?: No Anxiety?: No  Physical Exam: BP (!) 157/89   Pulse (!) 55   Ht 5\' 5"  (1.651 m)   Wt 225 lb (102.1 kg)   BMI 37.44 kg/m   Constitutional:  Alert and oriented, No acute distress. HEENT: [place] AT, moist mucus membranes.  Trachea midline, no masses. Cardiovascular: No clubbing, cyanosis, or edema. Respiratory: Normal respiratory effort, no increased work of breathing. Rectal: Normal sphincter tone, 50 cc prostate nontender no nodules Skin: No rashes, bruises or suspicious lesions. Neurologic: Grossly intact, no focal deficits, moving all 4 extremities. Psychiatric: Normal mood and affect.   Urinalysis   Pertinent Imaging: Results for orders placed or performed in visit on 09/03/19  BLADDER SCAN AMB  NON-IMAGING  Result Value Ref Range   Scan Result 170ml     Assessment & Plan:    1. BPH with obstruction/lower urinary tract symptoms Continue flomax F  Not interested in surgical intervention  As per previous conversation, we could continue to try to optimize his comanagement he has been off her finasteride on multiple occasions.  Is little bit unclear why he has not ever started taking this.  He mentions today that he would be willing again this will renew this prescription.   -  BLADDER SCAN AMB NON-IMAGING - PSA; Future  2. Elevated PSA PSA is likely appropriate for age is reasonably stable  We will check it one last time today and if remains stable, would recommend deferring further PSA evaluation.  If it is rising, will continue for 1 more year. - PSA; Future  3. Incomplete bladder emptying Borderline incomplete bladder emptying, will continue to follow, no sequela   Return in about 1 year (around 09/02/2020) for [facility] for IPSS/ PVA, possible PSA/ DRE pending today results.  Vanna Scotland, MD  [facility] [address], [address] [place], [place] [address] [phone]

## 2019-09-07 ENCOUNTER — Telehealth: Payer: Self-pay | Admitting: *Deleted

## 2019-09-07 NOTE — Telephone Encounter (Addendum)
Left patient a message with details, asked to call back to verify or if he had questions.   ----- Message from Vanna Scotland, MD sent at 09/05/2019  1:26 PM EST ----- PSA is stable.  Recommend no further PSA screening based on age and medical comorbidities in the future.  Follow-up next year as discussed to reassess urinary symptoms.  Vanna Scotland, MD

## 2020-01-31 ENCOUNTER — Emergency Department: Payer: Medicare Other

## 2020-01-31 ENCOUNTER — Other Ambulatory Visit: Payer: Self-pay

## 2020-01-31 DIAGNOSIS — N183 Chronic kidney disease, stage 3 unspecified: Secondary | ICD-10-CM | POA: Diagnosis not present

## 2020-01-31 DIAGNOSIS — N179 Acute kidney failure, unspecified: Secondary | ICD-10-CM | POA: Diagnosis not present

## 2020-01-31 DIAGNOSIS — K859 Acute pancreatitis without necrosis or infection, unspecified: Secondary | ICD-10-CM | POA: Insufficient documentation

## 2020-01-31 DIAGNOSIS — R0789 Other chest pain: Secondary | ICD-10-CM | POA: Diagnosis not present

## 2020-01-31 DIAGNOSIS — I129 Hypertensive chronic kidney disease with stage 1 through stage 4 chronic kidney disease, or unspecified chronic kidney disease: Secondary | ICD-10-CM | POA: Insufficient documentation

## 2020-01-31 DIAGNOSIS — K449 Diaphragmatic hernia without obstruction or gangrene: Secondary | ICD-10-CM | POA: Diagnosis not present

## 2020-01-31 DIAGNOSIS — Z79899 Other long term (current) drug therapy: Secondary | ICD-10-CM | POA: Diagnosis not present

## 2020-01-31 LAB — BASIC METABOLIC PANEL
Anion gap: 10 (ref 5–15)
BUN: 32 mg/dL — ABNORMAL HIGH (ref 8–23)
CO2: 26 mmol/L (ref 22–32)
Calcium: 9 mg/dL (ref 8.9–10.3)
Chloride: 107 mmol/L (ref 98–111)
Creatinine, Ser: 1.67 mg/dL — ABNORMAL HIGH (ref 0.61–1.24)
GFR calc Af Amer: 44 mL/min — ABNORMAL LOW (ref >60)
GFR calc non Af Amer: 38 mL/min — ABNORMAL LOW (ref >60)
Glucose, Bld: 150 mg/dL — ABNORMAL HIGH (ref 70–99)
Potassium: 4.9 mmol/L (ref 3.5–5.1)
Sodium: 143 mmol/L (ref 135–145)

## 2020-01-31 LAB — TROPONIN I (HIGH SENSITIVITY): Troponin I (High Sensitivity): 14 ng/L (ref <18)

## 2020-01-31 LAB — CBC
HCT: 37.2 % — ABNORMAL LOW (ref 39.0–52.0)
Hemoglobin: 11.1 g/dL — ABNORMAL LOW (ref 13.0–17.0)
MCH: 24 pg — ABNORMAL LOW (ref 26.0–34.0)
MCHC: 29.8 g/dL — ABNORMAL LOW (ref 30.0–36.0)
MCV: 80.5 fL (ref 80.0–100.0)
Platelets: 189 10*3/uL (ref 150–400)
RBC: 4.62 MIL/uL (ref 4.22–5.81)
RDW: 16.7 % — ABNORMAL HIGH (ref 11.5–15.5)
WBC: 8.8 10*3/uL (ref 4.0–10.5)
nRBC: 0 % (ref 0.0–0.2)

## 2020-01-31 MED ORDER — SODIUM CHLORIDE 0.9% FLUSH
3.0000 mL | Freq: Once | INTRAVENOUS | Status: AC
  Administered 2020-02-01: 3 mL via INTRAVENOUS

## 2020-01-31 NOTE — ED Triage Notes (Signed)
Pt arrives to ED via POV from home with c/o CP x1 day. Pt reports bilateral chest pain; no radiation into the neck, arm, or back. Pt (+) SHOB; (+) emesis x1. Pt denies any previous cardiac h/x. Pt is A&O, in NAD; RR even, regular, and unlabored.

## 2020-02-01 ENCOUNTER — Emergency Department: Payer: Medicare Other

## 2020-02-01 ENCOUNTER — Emergency Department
Admission: EM | Admit: 2020-02-01 | Discharge: 2020-02-01 | Disposition: A | Discharge status: Home / Self Care | Payer: Medicare Other | Attending: Emergency Medicine | Admitting: Emergency Medicine

## 2020-02-01 DIAGNOSIS — N179 Acute kidney failure, unspecified: Secondary | ICD-10-CM

## 2020-02-01 DIAGNOSIS — K449 Diaphragmatic hernia without obstruction or gangrene: Secondary | ICD-10-CM

## 2020-02-01 DIAGNOSIS — K859 Acute pancreatitis without necrosis or infection, unspecified: Secondary | ICD-10-CM

## 2020-02-01 DIAGNOSIS — R079 Chest pain, unspecified: Secondary | ICD-10-CM

## 2020-02-01 LAB — HEPATIC FUNCTION PANEL
ALT: 17 U/L (ref 0–44)
AST: 21 U/L (ref 15–41)
Albumin: 4.1 g/dL (ref 3.5–5.0)
Alkaline Phosphatase: 56 U/L (ref 38–126)
Bilirubin, Direct: 0.1 mg/dL (ref 0.0–0.2)
Indirect Bilirubin: 0.6 mg/dL (ref 0.3–0.9)
Total Bilirubin: 0.7 mg/dL (ref 0.3–1.2)
Total Protein: 7 g/dL (ref 6.5–8.1)

## 2020-02-01 LAB — TROPONIN I (HIGH SENSITIVITY): Troponin I (High Sensitivity): 13 ng/L (ref <18)

## 2020-02-01 LAB — LIPASE, BLOOD: Lipase: 69 U/L — ABNORMAL HIGH (ref 11–51)

## 2020-02-01 MED ORDER — ONDANSETRON 4 MG PO TBDP
4.0000 mg | ORAL_TABLET | Freq: Three times a day (TID) | ORAL | 0 refills | Status: DC | PRN
Start: 2020-02-01 — End: 2021-09-02

## 2020-02-01 MED ORDER — HYDROCODONE-ACETAMINOPHEN 5-325 MG PO TABS: 1.0000 | ORAL_TABLET | Freq: Four times a day (QID) | ORAL | 0 refills | Status: DC | PRN

## 2020-02-01 MED ORDER — FAMOTIDINE IN NACL 20-0.9 MG/50ML-% IV SOLN
20.0000 mg | Freq: Once | INTRAVENOUS | Status: AC
  Administered 2020-02-01: 20 mg via INTRAVENOUS
  Filled 2020-02-01 (×2): qty 50

## 2020-02-01 MED ORDER — SODIUM CHLORIDE 0.9 % IV BOLUS
1000.0000 mL | Freq: Once | INTRAVENOUS | Status: AC
  Administered 2020-02-01: 1000 mL via INTRAVENOUS

## 2020-02-01 NOTE — ED Provider Notes (Signed)
[facility] Emergency Department Provider Note   ____________________________________________   First MD Initiated Contact with Patient 02/01/20 [phone]     (approximate)  I have reviewed the triage vital signs and the nursing notes.   HISTORY  Chief Complaint Chest Pain    HPI Jose Baker is a 79 y.o. male who presents to the ED from home with a chief complaint of chest pain.  Patient reports lower chest/epigastric pain yesterday without radiation.  Symptoms associated with shortness of breath and one episode of emesis.  Denies fever, cough, diaphoresis, dysuria, diarrhea.  Denies recent travel or trauma.      Past Medical History:  Diagnosis Date  . Arrhythmia   . HTN (hypertension)   . Hypercholesteremia   . Obesity   . Sleep apnea     Patient Active Problem List   Diagnosis Date Noted  . Nonrheumatic aortic valve insufficiency 09/01/2019  . 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 CKD (chronic kidney disease) stage 3, GFR 30-59 ml/min 12/04/2017  . Paroxysmal atrial fibrillation (HCC) 08/13/2016  . Arthritis 08/12/2016  . OSA (obstructive sleep apnea) 08/12/2016  . Cerebral aneurysm without rupture 04/26/2016  . GERD (gastroesophageal reflux disease) 03/07/2014  . Hyperlipidemia 03/07/2014  . Hypertension 03/07/2014    Past Surgical History:  Procedure Laterality Date  . BRAIN SURGERY    . CATARACT EXTRACTION      Prior to Admission medications   Medication Sig Start Date End Date Taking? Authorizing Provider  amLODipine (NORVASC) 2.5 MG tablet Take 2.5 mg by mouth daily. 06/07/17   [provider]  b complex vitamins tablet Take 1 tablet by mouth daily.    [provider]  Calcium Carbonate-Vitamin D (CALCIUM 500 + D PO) Take by mouth daily.    [provider]  Cholecalciferol (VITAMIN D3) 2000 UNITS TABS Take by mouth daily.    [provider]    finasteride (PROSCAR) 5 MG tablet Take 1 tablet (5 mg total) by mouth daily. 09/03/19   Vanna Scotland, MD  fish oil-omega-3 fatty acids 1000 MG capsule Take 2 g by mouth daily.    [provider]  HYDROcodone-acetaminophen (NORCO) 5-325 MG tablet Take 1 tablet by mouth every 6 (six) hours as needed for moderate pain. 02/01/20   Irean Hong, MD  lisinopril (ZESTRIL) 5 MG tablet Take 5 mg by mouth daily. 07/28/19   [provider]  Melatonin 3 MG TABS Take by mouth. 08/20/16   [provider]  metoprolol succinate (TOPROL-XL) 100 MG 24 hr tablet Take 100 mg by mouth daily. 06/15/17   [provider]  omeprazole (PRILOSEC) 20 MG capsule TAKE 1 CAPSULE (20 MG TOTAL) BY MOUTH ONCE DAILY. 02/08/16   [provider]  ondansetron (ZOFRAN ODT) 4 MG disintegrating tablet Take 1 tablet (4 mg total) by mouth every 8 (eight) hours as needed for nausea or vomiting. 02/01/20   Irean Hong, MD  QUEtiapine (SEROQUEL) 50 MG tablet Take by mouth. 08/20/16   [provider]  Selenium 200 MCG CAPS Take by mouth daily.    [provider]  verapamil (CALAN-SR) 180 MG CR tablet Take 180 mg by mouth at bedtime.    [provider]    Allergies Lipitor [atorvastatin calcium]  Family History  Problem Relation Age of Onset  . Alzheimer's disease Other   . Hypertension Other   . Prostate cancer Neg Hx   . Bladder Cancer Neg  Hx   . Kidney cancer Neg Hx     Social History Social History   Tobacco Use  . Smoking status: Never Smoker  . Smokeless tobacco: Never Used  Substance Use Topics  . Alcohol use: No  . Drug use: No    Review of Systems  Constitutional: No fever/chills Eyes: No visual changes. ENT: No sore throat. Cardiovascular: Positive for chest pain. Respiratory: Positive for shortness of breath. Gastrointestinal: No abdominal pain.  Positive for 1 episode of vomiting.  No diarrhea.  No constipation. Genitourinary: Negative for  dysuria. Musculoskeletal: Negative for back pain. Skin: Negative for rash. Neurological: Negative for headaches, focal weakness or numbness.   ____________________________________________   PHYSICAL EXAM:  VITAL SIGNS: ED Triage Vitals  Enc Vitals Group     BP 01/31/20 2310 (!) 147/96     Pulse Rate 01/31/20 2310 (!) 59     Resp 01/31/20 2310 18     Temp 01/31/20 2310 97.6 F (36.4 C)     Temp Source 01/31/20 2310 Oral     SpO2 01/31/20 2310 97 %     Weight 01/31/20 2307 220 lb (99.8 kg)     Height 01/31/20 2307 5\' 5"  (1.651 m)     Head Circumference --      Peak Flow --      Pain Score 01/31/20 2306 10     Pain Loc --      Pain Edu? --      Excl. in GC? --     Constitutional: Asleep, awakened for exam.  Alert and oriented. Well appearing and in no acute distress. Eyes: Conjunctivae are normal. PERRL. EOMI. Head: Atraumatic. Nose: No congestion/rhinnorhea. Mouth/Throat: Mucous membranes are moist.   Neck: No stridor.   Cardiovascular: Normal rate, regular rhythm. Grossly normal heart sounds.  Good peripheral circulation. Respiratory: Normal respiratory effort.  No retractions. Lungs CTAB. Gastrointestinal: Soft and nontender to light or deep palpation. No distention. No abdominal bruits. No CVA tenderness. Musculoskeletal: No lower extremity tenderness nor edema.  No joint effusions. Neurologic:  Normal speech and language. No gross focal neurologic deficits are appreciated. No gait instability. Skin:  Skin is warm, dry and intact. No rash noted. Psychiatric: Mood and affect are normal. Speech and behavior are normal.  ____________________________________________   LABS (all labs ordered are listed, but only abnormal results are displayed)  Labs Reviewed  BASIC METABOLIC PANEL - Abnormal; Notable for the following components:      Result Value   Glucose, Bld 150 (*)    BUN 32 (*)    Creatinine, Ser 1.67 (*)    GFR calc non Af Amer 38 (*)    GFR calc Af Amer 44  (*)    All other components within normal limits  CBC - Abnormal; Notable for the following components:   Hemoglobin 11.1 (*)    HCT 37.2 (*)    MCH 24.0 (*)    MCHC 29.8 (*)    RDW 16.7 (*)    All other components within normal limits  LIPASE, BLOOD - Abnormal; Notable for the following components:   Lipase 69 (*)    All other components within normal limits  HEPATIC FUNCTION PANEL  TROPONIN I (HIGH SENSITIVITY)  TROPONIN I (HIGH SENSITIVITY)   ____________________________________________  EKG  ED ECG REPORT I, Mckynlee Luse J, the attending physician, personally viewed and interpreted this ECG.   Date: 02/01/2020  EKG Time: 2301  Rate: 60  Rhythm: Atrial fibrillation  Axis: Normal  Intervals:none  ST&T Change: Nonspecific  ____________________________________________  RADIOLOGY  ED MD interpretation: Large hiatal hernia  Official radiology report(s): DG Chest 2 View  Result Date: 01/31/2020 CLINICAL DATA:  Chest pain. EXAM: CHEST - 2 VIEW COMPARISON:  July 25, 2009 FINDINGS: A trace amount of atelectasis is seen along the right lung base. There is no evidence of acute infiltrate, pleural effusion or pneumothorax. The cardiac silhouette is moderately enlarged. A large hiatal hernia is noted. The visualized skeletal structures are unremarkable. IMPRESSION: 1. Trace amount of right basilar atelectasis. 2. Large hiatal hernia. Electronically Signed   By: Aram Candela M.D.   On: 01/31/2020 23:28    ____________________________________________   PROCEDURES  Procedure(s) performed (including Critical Care):  .1-3 Lead EKG Interpretation Performed by: Irean Hong, MD Authorized by: Irean Hong, MD     Interpretation: abnormal     ECG rate:  60   ECG rate assessment: normal     Rhythm: atrial fibrillation     Ectopy: none     Conduction: normal   Comments:     Patient placed on cardiac monitor to evaluate for  arrhythmias     ____________________________________________   INITIAL IMPRESSION / ASSESSMENT AND PLAN / ED COURSE  As part of my medical decision making, I reviewed the following data within the electronic MEDICAL RECORD NUMBER Nursing notes reviewed and incorporated, Labs reviewed, EKG interpreted, Old chart reviewed, Radiograph reviewed and Notes from prior ED visits     Jose Baker was evaluated in Emergency Department on 02/01/2020 for the symptoms described in the history of present illness. He was evaluated in the context of the global COVID-19 pandemic, which necessitated consideration that the patient might be at risk for infection with the SARS-CoV-2 virus that causes COVID-19. Institutional protocols and algorithms that pertain to the evaluation of patients at risk for COVID-19 are in a state of rapid change based on information released by regulatory bodies including the CDC and federal and state organizations. These policies and algorithms were followed during the patient's care in the ED.    79 year old male presenting with chest pain yesterday. Differential diagnosis includes, but is not limited to, ACS, aortic dissection, pulmonary embolism, cardiac tamponade, pneumothorax, pneumonia, pericarditis, myocarditis, GI-related causes including esophagitis/gastritis, and musculoskeletal chest wall pain.    Patient was asleep at the time of our interview and examination.  No chest pain at present.  Both sets of troponins unremarkable.  Large hiatal hernia seen on chest x-ray; will check LFTs and lipase.  Mild AKI noted; will infuse IV fluids.  Administer IV Pepcid and reassess.   Clinical Course as of Jan 31 657  Tue Feb 01, 2020  7517 Updated patient and spouse on LFTs and lipase.  Patient still has gallbladder intact.  Will obtain right upper quadrant abdominal ultrasound to evaluate for cholecystitis.   [JS]  [id number] Patient awaiting ultrasound.  Care transferred to Dr. Darnelle Catalan at  change of shift.  Anticipate discharge home if ultrasound unremarkable.   [JS]    Clinical Course User Index [JS] Irean Hong, MD     ____________________________________________   FINAL CLINICAL IMPRESSION(S) / ED DIAGNOSES  Final diagnoses:  Nonspecific chest pain  Hiatal hernia  AKI (acute kidney injury) (HCC)  Acute pancreatitis, unspecified complication status, unspecified pancreatitis type     ED Discharge Orders         Ordered    HYDROcodone-acetaminophen (NORCO) 5-325 MG tablet  Every 6 hours PRN     Discontinue  Reprint     02/01/20 0545    ondansetron (ZOFRAN ODT) 4 MG disintegrating tablet  Every 8 hours PRN     Discontinue  Reprint     02/01/20 0545           Note:  This document was prepared using Dragon voice recognition software and may include unintentional dictation errors.   Irean Hong, MD 02/01/20 [id number]

## 2020-02-01 NOTE — Discharge Instructions (Addendum)
1.  You may take medicines as needed for pain and nausea (Norco/Zofran #15). 2.  Return to the ER for worsening symptoms, persistent vomiting, difficulty breathing or other concerns.

## 2020-02-01 NOTE — ED Provider Notes (Signed)
Patient's ultrasound shows medical renal disease but no acute findings.  Also possibly some fatty liver.  I will let the patient go as planned.  He is anxious to leave.  His troponin is going down.  He will follow up   Arnaldo Natal, MD 02/01/20 [phone]

## 2020-04-11 ENCOUNTER — Other Ambulatory Visit: Payer: Self-pay | Admitting: Family Medicine

## 2020-04-11 DIAGNOSIS — R1031 Right lower quadrant pain: Secondary | ICD-10-CM

## 2020-04-14 ENCOUNTER — Other Ambulatory Visit: Payer: Self-pay

## 2020-04-14 ENCOUNTER — Ambulatory Visit
Admission: RE | Admit: 2020-04-14 | Discharge: 2020-04-14 | Disposition: A | Discharge status: Home / Self Care | Payer: Medicare Other | Source: Ambulatory Visit | Attending: Family Medicine | Admitting: Family Medicine

## 2020-04-14 DIAGNOSIS — R1031 Right lower quadrant pain: Secondary | ICD-10-CM | POA: Insufficient documentation

## 2020-04-28 ENCOUNTER — Ambulatory Visit
Admission: RE | Admit: 2020-04-28 | Discharge: 2020-04-28 | Disposition: A | Discharge status: Home / Self Care | Payer: Medicare Other | Source: Ambulatory Visit | Attending: Family Medicine | Admitting: Family Medicine

## 2020-04-28 ENCOUNTER — Other Ambulatory Visit: Payer: Self-pay

## 2020-04-28 ENCOUNTER — Other Ambulatory Visit: Payer: Self-pay | Admitting: Family Medicine

## 2020-04-28 DIAGNOSIS — R4182 Altered mental status, unspecified: Secondary | ICD-10-CM

## 2020-04-28 MED ORDER — IOHEXOL 350 MG/ML SOLN
60.0000 mL | Freq: Once | INTRAVENOUS | Status: AC | PRN
  Administered 2020-04-28: 60 mL via INTRAVENOUS

## 2020-08-11 ENCOUNTER — Ambulatory Visit
Admission: RE | Admit: 2020-08-11 | Discharge: 2020-08-11 | Disposition: A | Discharge status: Home / Self Care | Payer: Medicare Other | Source: Ambulatory Visit | Attending: Student | Admitting: Student

## 2020-08-11 ENCOUNTER — Other Ambulatory Visit: Payer: Self-pay | Admitting: Student

## 2020-08-11 ENCOUNTER — Other Ambulatory Visit: Payer: Self-pay

## 2020-08-11 DIAGNOSIS — M7989 Other specified soft tissue disorders: Secondary | ICD-10-CM

## 2020-08-14 ENCOUNTER — Ambulatory Visit: Admission: RE | Admit: 2020-08-14 | Payer: Medicare Other | Source: Ambulatory Visit

## 2020-08-14 ENCOUNTER — Other Ambulatory Visit: Payer: Medicare Other

## 2020-09-01 NOTE — Progress Notes (Deleted)
09/04/2020 8:09 AM   Jose Baker 08/23/1940 [id number]  Referring provider: Sofie Hartigan, [place],  [place] [address]  No chief complaint on file.  Urological history: 1. BPH with LU TS - I PSS *** - PVR *** - managed with tamusulosin 0.4 mg daily and finasteride 5 mg daily ***  2. Elevated PSA - 4.42 in 08/2019 - screening discontinued due to age and co-morbidities   HPI: Jose Baker is a 80 y.o. male who presents today for a one year follow up.      PMH: Past Medical History:  Diagnosis Date  . Arrhythmia   . HTN (hypertension)   . Hypercholesteremia   . Obesity   . Sleep apnea     Surgical History: Past Surgical History:  Procedure Laterality Date  . BRAIN SURGERY    . CATARACT EXTRACTION      Home Medications:  Allergies as of 09/04/2020      Reactions   Lipitor [atorvastatin Calcium]       Medication List       Accurate as of September 01, 2020  8:09 AM. If you have any questions, ask your nurse or doctor.        amLODipine 2.5 MG tablet Commonly known as: NORVASC Take 2.5 mg by mouth daily.   b complex vitamins tablet Take 1 tablet by mouth daily.   CALCIUM 500 + D PO Take by mouth daily.   finasteride 5 MG tablet Commonly known as: PROSCAR Take 1 tablet (5 mg total) by mouth daily.   fish oil-omega-3 fatty acids 1000 MG capsule Take 2 g by mouth daily.   HYDROcodone-acetaminophen 5-325 MG tablet Commonly known as: Norco Take 1 tablet by mouth every 6 (six) hours as needed for moderate pain.   lisinopril 5 MG tablet Commonly known as: ZESTRIL Take 5 mg by mouth daily.   melatonin 3 MG Tabs tablet Take by mouth.   metoprolol succinate 100 MG 24 hr tablet Commonly known as: TOPROL-XL Take 100 mg by mouth daily.   omeprazole 20 MG capsule Commonly known as: PRILOSEC TAKE 1 CAPSULE (20 MG TOTAL) BY MOUTH ONCE DAILY.   ondansetron 4 MG disintegrating tablet Commonly known as: Zofran ODT Take 1  tablet (4 mg total) by mouth every 8 (eight) hours as needed for nausea or vomiting.   QUEtiapine 50 MG tablet Commonly known as: SEROQUEL Take by mouth.   Selenium 200 MCG Caps Take by mouth daily.   verapamil 180 MG CR tablet Commonly known as: CALAN-SR Take 180 mg by mouth at bedtime.   Vitamin D3 50 MCG (2000 UT) Tabs Take by mouth daily.       Allergies:  Allergies  Allergen Reactions  . Lipitor [Atorvastatin Calcium]     Family History: Family History  Problem Relation Age of Onset  . Alzheimer's disease Other   . Hypertension Other   . Prostate cancer Neg Hx   . Bladder Cancer Neg Hx   . Kidney cancer Neg Hx     Social History:  reports that he has never smoked. He has never used smokeless tobacco. He reports that he does not drink alcohol and does not use drugs.  ROS: Pertinent ROS in HPI  Physical Exam: There were no vitals taken for this visit.  Constitutional:  Well nourished. Alert and oriented, No acute distress. HEENT: [place] AT, moist mucus membranes.  Trachea midline, no masses. Cardiovascular: No clubbing, cyanosis, or edema.  Respiratory: Normal respiratory effort, no increased work of breathing. GI: Abdomen is soft, non tender, non distended, no abdominal masses. Liver and spleen not palpable.  No hernias appreciated.  Stool sample for occult testing is not indicated.   GU: No CVA tenderness.  No bladder fullness or masses.  Patient with circumcised/uncircumcised phallus. ***Foreskin easily retracted***  Urethral meatus is patent.  No penile discharge. No penile lesions or rashes. Scrotum without lesions, cysts, rashes and/or edema.  Testicles are located scrotally bilaterally. No masses are appreciated in the testicles. Left and right epididymis are normal. Rectal: Patient with  normal sphincter tone. Anus and perineum without scarring or rashes. No rectal masses are appreciated. Prostate is approximately *** grams, *** nodules are appreciated. Seminal  vesicles are normal. Skin: No rashes, bruises or suspicious lesions. Lymph: No cervical or inguinal adenopathy. Neurologic: Grossly intact, no focal deficits, moving all 4 extremities. Psychiatric: Normal mood and affect.  Laboratory Data: Specimen:  Urine  Ref Range & Units 3 mo ago  Color Yellow Yellow   Clarity Clear Clear   Specific Gravity 1.000 - 1.030 1.025   pH, Urine 5.0 - 8.0 5.0   Protein, Urinalysis Negative, Trace mg/dL 100 Abnormal   Glucose, Urinalysis Negative mg/dL Negative   Ketones, Urinalysis Negative mg/dL Negative   Blood, Urinalysis Negative TraceAbnormal   Nitrite, Urinalysis Negative Negative   Leukocyte Esterase, Urinalysis Negative Negative   White Blood Cells, Urinalysis None Seen, 0-3 /hpf 0-3   Red Blood Cells, Urinalysis None Seen, 0-3 /hpf 0-3   Bacteria, Urinalysis None Seen /hpf RareAbnormal   Squamous Epithelial Cells, Urinalysis Rare, Few, None Seen /hpf None Seen   Resulting Agency  [facility] - [facility]  Specimen Collected: 05/29/20 12:33 PM Last Resulted: 05/29/20 1:44 PM  Received From: [place]  Result Received: 08/11/20 1:49 PM   Specimen:  Blood  Ref Range & Units 3 wk ago  WBC (White Blood Cell Count) 4.1 - 10.2 10^3/uL 8.4   RBC (Red Blood Cell Count) 4.69 - 6.13 10^6/uL 4.79   Hemoglobin 14.1 - 18.1 gm/dL 12.2Low   Hematocrit 40.0 - 52.0 % 39.7Low   MCV (Mean Corpuscular Volume) 80.0 - 100.0 fl 82.9   MCH (Mean Corpuscular Hemoglobin) 27.0 - 31.2 pg 25.5Low   MCHC (Mean Corpuscular Hemoglobin Concentration) 32.0 - 36.0 gm/dL 30.7Low   Platelet Count 150 - 450 10^3/uL 186   RDW-CV (Red Cell Distribution Width) 11.6 - 14.8 % 15.9High   MPV (Mean Platelet Volume) 9.4 - 12.4 fl 9.5   Neutrophils 1.50 - 7.80 10^3/uL 6.45   Lymphocytes 1.00 - 3.60 10^3/uL 0.97Low   Monocytes 0.00 - 1.50 10^3/uL 0.77   Eosinophils 0.00 - 0.55 10^3/uL 0.04   Basophils 0.00 - 0.09 10^3/uL 0.02   Neutrophil  % 32.0 - 70.0 % 76.9High   Lymphocyte % 10.0 - 50.0 % 11.6   Monocyte % 4.0 - 13.0 % 9.2   Eosinophil % 1.0 - 5.0 % 0.5Low   Basophil% 0.0 - 2.0 % 0.2   Immature Granulocyte % <=0.7 % 1.6High   Immature Granulocyte Count <=0.06 10^3/L 0.[facility] - LAB  Specimen Collected: 08/11/20 2:15 PM Last Resulted: 08/11/20 2:21 PM  Received From: [facility]  Result Received: 09/01/20 8:09 AM   Specimen:  Blood  Ref Range & Units 4 wk ago  Glucose 70 - 110 mg/dL 82   Sodium 136 - 145 mmol/L 143  Potassium 3.6 - 5.1 mmol/L 3.9   Chloride 97 - 109 mmol/L 107   Carbon Dioxide (CO2) 22.0 - 32.0 mmol/L 29.8   Calcium 8.7 - 10.3 mg/dL 9.3   Urea Nitrogen (BUN) 7 - 25 mg/dL 24   Creatinine 0.7 - 1.3 mg/dL 1.6High   Glomerular Filtration Rate (eGFR), MDRD Estimate >60 mL/min/1.73sq m 42Low   BUN/Crea Ratio 6.0 - 20.0 15.0   Anion Gap w/K 6.0 - 16.0 10.1   Resulting Agency  [facility] - LAB  Specimen Collected: 08/04/20 12:06 PM Last Resulted: 08/04/20 4:12 PM  Received From: [facility]  Result Received: 09/01/20 8:09 AM  I have reviewed the labs.   Pertinent Imaging: ***  Assessment & Plan:  ***  1. BPH with LUTS IPSS score is ***, it is stable/improving/worsening Continue conservative management, avoiding bladder irritants and timed voiding's Most bothersome symptoms is/are *** Initiate alpha-blocker (***), discussed side effects *** Initiate 5 alpha reductase inhibitor (***), discussed side effects *** Continue tamsulosin 0.4 mg daily, alfuzosin 10 mg daily, Rapaflo 8 mg daily, terazosin, doxazosin, Cialis 5 mg daily and finasteride 5 mg daily, dutasteride 0.5 mg daily***:refills given Cannot tolerate medication or medication failure, schedule cystoscopy *** RTC in *** months for IPSS, PSA, PVR and exam   2. Incomplete emptying  ***     No follow-ups on file.  These notes  generated with voice recognition software. I apologize for typographical errors.  Zara Council, PA-C  [facility] [address]  [place], [place] [address] [phone]

## 2020-09-04 ENCOUNTER — Ambulatory Visit: Payer: Medicare Other | Admitting: Urology

## 2020-09-04 DIAGNOSIS — R339 Retention of urine, unspecified: Secondary | ICD-10-CM

## 2020-09-04 DIAGNOSIS — N138 Other obstructive and reflux uropathy: Secondary | ICD-10-CM

## 2021-05-17 ENCOUNTER — Other Ambulatory Visit: Payer: Self-pay | Admitting: Gastroenterology

## 2021-05-17 DIAGNOSIS — R112 Nausea with vomiting, unspecified: Secondary | ICD-10-CM

## 2021-05-17 DIAGNOSIS — R1013 Epigastric pain: Secondary | ICD-10-CM

## 2021-06-06 ENCOUNTER — Ambulatory Visit
Admission: RE | Admit: 2021-06-06 | Discharge: 2021-06-06 | Disposition: A | Discharge status: Home / Self Care | Payer: Medicare Other | Source: Ambulatory Visit | Attending: Gastroenterology | Admitting: Gastroenterology

## 2021-06-06 ENCOUNTER — Other Ambulatory Visit: Payer: Medicare Other

## 2021-06-06 ENCOUNTER — Other Ambulatory Visit: Payer: Self-pay

## 2021-06-06 DIAGNOSIS — R1013 Epigastric pain: Secondary | ICD-10-CM | POA: Insufficient documentation

## 2021-06-06 DIAGNOSIS — R112 Nausea with vomiting, unspecified: Secondary | ICD-10-CM | POA: Diagnosis present

## 2021-08-28 ENCOUNTER — Emergency Department: Payer: Medicare Other

## 2021-08-28 ENCOUNTER — Other Ambulatory Visit: Payer: Self-pay

## 2021-08-28 ENCOUNTER — Observation Stay: Payer: Medicare Other

## 2021-08-28 ENCOUNTER — Inpatient Hospital Stay
Admission: EM | Admit: 2021-08-28 | Discharge: 2021-09-02 | DRG: 872 | Disposition: A | Discharge status: Home / Self Care | Payer: Medicare Other | Attending: Family Medicine | Admitting: Family Medicine

## 2021-08-28 DIAGNOSIS — M7989 Other specified soft tissue disorders: Secondary | ICD-10-CM | POA: Diagnosis not present

## 2021-08-28 DIAGNOSIS — A400 Sepsis due to streptococcus, group A: Secondary | ICD-10-CM | POA: Diagnosis not present

## 2021-08-28 DIAGNOSIS — Z79899 Other long term (current) drug therapy: Secondary | ICD-10-CM

## 2021-08-28 DIAGNOSIS — K219 Gastro-esophageal reflux disease without esophagitis: Secondary | ICD-10-CM | POA: Diagnosis present

## 2021-08-28 DIAGNOSIS — N179 Acute kidney failure, unspecified: Secondary | ICD-10-CM | POA: Diagnosis present

## 2021-08-28 DIAGNOSIS — Z6827 Body mass index (BMI) 27.0-27.9, adult: Secondary | ICD-10-CM

## 2021-08-28 DIAGNOSIS — E785 Hyperlipidemia, unspecified: Secondary | ICD-10-CM | POA: Diagnosis present

## 2021-08-28 DIAGNOSIS — Z7901 Long term (current) use of anticoagulants: Secondary | ICD-10-CM

## 2021-08-28 DIAGNOSIS — M1A9XX0 Chronic gout, unspecified, without tophus (tophi): Secondary | ICD-10-CM | POA: Diagnosis present

## 2021-08-28 DIAGNOSIS — Z23 Encounter for immunization: Secondary | ICD-10-CM

## 2021-08-28 DIAGNOSIS — Z820 Family history of epilepsy and other diseases of the nervous system: Secondary | ICD-10-CM

## 2021-08-28 DIAGNOSIS — Z8249 Family history of ischemic heart disease and other diseases of the circulatory system: Secondary | ICD-10-CM

## 2021-08-28 DIAGNOSIS — Z888 Allergy status to other drugs, medicaments and biological substances status: Secondary | ICD-10-CM

## 2021-08-28 DIAGNOSIS — E872 Acidosis, unspecified: Secondary | ICD-10-CM | POA: Diagnosis present

## 2021-08-28 DIAGNOSIS — G4733 Obstructive sleep apnea (adult) (pediatric): Secondary | ICD-10-CM | POA: Diagnosis present

## 2021-08-28 DIAGNOSIS — N320 Bladder-neck obstruction: Secondary | ICD-10-CM | POA: Diagnosis present

## 2021-08-28 DIAGNOSIS — A409 Streptococcal sepsis, unspecified: Secondary | ICD-10-CM

## 2021-08-28 DIAGNOSIS — Z20822 Contact with and (suspected) exposure to covid-19: Secondary | ICD-10-CM | POA: Diagnosis present

## 2021-08-28 DIAGNOSIS — I10 Essential (primary) hypertension: Secondary | ICD-10-CM | POA: Diagnosis present

## 2021-08-28 DIAGNOSIS — Z91199 Patient's noncompliance with other medical treatment and regimen due to unspecified reason: Secondary | ICD-10-CM

## 2021-08-28 DIAGNOSIS — I480 Paroxysmal atrial fibrillation: Secondary | ICD-10-CM | POA: Diagnosis present

## 2021-08-28 DIAGNOSIS — L03114 Cellulitis of left upper limb: Secondary | ICD-10-CM | POA: Diagnosis not present

## 2021-08-28 DIAGNOSIS — F03A0 Unspecified dementia, mild, without behavioral disturbance, psychotic disturbance, mood disturbance, and anxiety: Secondary | ICD-10-CM | POA: Diagnosis present

## 2021-08-28 DIAGNOSIS — N1832 Chronic kidney disease, stage 3b: Secondary | ICD-10-CM | POA: Diagnosis present

## 2021-08-28 DIAGNOSIS — I428 Other cardiomyopathies: Secondary | ICD-10-CM | POA: Diagnosis present

## 2021-08-28 DIAGNOSIS — H918X3 Other specified hearing loss, bilateral: Secondary | ICD-10-CM | POA: Diagnosis present

## 2021-08-28 DIAGNOSIS — A419 Sepsis, unspecified organism: Secondary | ICD-10-CM

## 2021-08-28 DIAGNOSIS — B950 Streptococcus, group A, as the cause of diseases classified elsewhere: Secondary | ICD-10-CM

## 2021-08-28 DIAGNOSIS — N400 Enlarged prostate without lower urinary tract symptoms: Secondary | ICD-10-CM

## 2021-08-28 DIAGNOSIS — E669 Obesity, unspecified: Secondary | ICD-10-CM | POA: Diagnosis present

## 2021-08-28 DIAGNOSIS — E7800 Pure hypercholesterolemia, unspecified: Secondary | ICD-10-CM | POA: Diagnosis present

## 2021-08-28 DIAGNOSIS — I129 Hypertensive chronic kidney disease with stage 1 through stage 4 chronic kidney disease, or unspecified chronic kidney disease: Secondary | ICD-10-CM | POA: Diagnosis present

## 2021-08-28 DIAGNOSIS — M1990 Unspecified osteoarthritis, unspecified site: Secondary | ICD-10-CM | POA: Diagnosis present

## 2021-08-28 HISTORY — DX: Gastro-esophageal reflux disease without esophagitis: K21.9

## 2021-08-28 HISTORY — DX: Unspecified atrial fibrillation: I48.91

## 2021-08-28 HISTORY — DX: Cerebral infarction, unspecified: I63.9

## 2021-08-28 LAB — CBC WITH DIFFERENTIAL/PLATELET
Abs Immature Granulocytes: 0.18 10*3/uL — ABNORMAL HIGH (ref 0.00–0.07)
Basophils Absolute: 0 10*3/uL (ref 0.0–0.1)
Basophils Relative: 0 %
Eosinophils Absolute: 0 10*3/uL (ref 0.0–0.5)
Eosinophils Relative: 0 %
HCT: 41.8 % (ref 39.0–52.0)
Hemoglobin: 12.3 g/dL — ABNORMAL LOW (ref 13.0–17.0)
Immature Granulocytes: 1 %
Lymphocytes Relative: 2 %
Lymphs Abs: 0.4 10*3/uL — ABNORMAL LOW (ref 0.7–4.0)
MCH: 25.2 pg — ABNORMAL LOW (ref 26.0–34.0)
MCHC: 29.4 g/dL — ABNORMAL LOW (ref 30.0–36.0)
MCV: 85.5 fL (ref 80.0–100.0)
Monocytes Absolute: 1.1 10*3/uL — ABNORMAL HIGH (ref 0.1–1.0)
Monocytes Relative: 6 %
Neutro Abs: 18.5 10*3/uL — ABNORMAL HIGH (ref 1.7–7.7)
Neutrophils Relative %: 91 %
Platelets: 153 10*3/uL (ref 150–400)
RBC: 4.89 MIL/uL (ref 4.22–5.81)
RDW: 16.4 % — ABNORMAL HIGH (ref 11.5–15.5)
WBC: 20.3 10*3/uL — ABNORMAL HIGH (ref 4.0–10.5)
nRBC: 0 % (ref 0.0–0.2)

## 2021-08-28 LAB — RESP PANEL BY RT-PCR (FLU A&B, COVID) ARPGX2
Influenza A by PCR: NEGATIVE
Influenza B by PCR: NEGATIVE
SARS Coronavirus 2 by RT PCR: NEGATIVE

## 2021-08-28 LAB — PROTIME-INR
INR: 1.5 — ABNORMAL HIGH (ref 0.8–1.2)
Prothrombin Time: 17.9 seconds — ABNORMAL HIGH (ref 11.4–15.2)

## 2021-08-28 LAB — COMPREHENSIVE METABOLIC PANEL
ALT: 21 U/L (ref 0–44)
AST: 36 U/L (ref 15–41)
Albumin: 4.1 g/dL (ref 3.5–5.0)
Alkaline Phosphatase: 78 U/L (ref 38–126)
Anion gap: 13 (ref 5–15)
BUN: 21 mg/dL (ref 8–23)
CO2: 22 mmol/L (ref 22–32)
Calcium: 9.3 mg/dL (ref 8.9–10.3)
Chloride: 103 mmol/L (ref 98–111)
Creatinine, Ser: 1.87 mg/dL — ABNORMAL HIGH (ref 0.61–1.24)
GFR, Estimated: 36 mL/min — ABNORMAL LOW (ref >60)
Glucose, Bld: 114 mg/dL — ABNORMAL HIGH (ref 70–99)
Potassium: 3.9 mmol/L (ref 3.5–5.1)
Sodium: 138 mmol/L (ref 135–145)
Total Bilirubin: 1.3 mg/dL — ABNORMAL HIGH (ref 0.3–1.2)
Total Protein: 7.3 g/dL (ref 6.5–8.1)

## 2021-08-28 LAB — TROPONIN I (HIGH SENSITIVITY)
Troponin I (High Sensitivity): 20 ng/L — ABNORMAL HIGH (ref <18)
Troponin I (High Sensitivity): 21 ng/L — ABNORMAL HIGH (ref <18)

## 2021-08-28 LAB — LACTIC ACID, PLASMA
Lactic Acid, Venous: 2.1 mmol/L (ref 0.5–1.9)
Lactic Acid, Venous: 4 mmol/L (ref 0.5–1.9)

## 2021-08-28 LAB — APTT: aPTT: 35 seconds (ref 24–36)

## 2021-08-28 MED ORDER — TETANUS-DIPHTH-ACELL PERTUSSIS 5-2.5-18.5 LF-MCG/0.5 IM SUSY
0.5000 mL | PREFILLED_SYRINGE | Freq: Once | INTRAMUSCULAR | Status: AC
  Administered 2021-08-29: 0.5 mL via INTRAMUSCULAR
  Filled 2021-08-28: qty 0.5

## 2021-08-28 MED ORDER — MELATONIN 5 MG PO TABS
10.0000 mg | ORAL_TABLET | Freq: Every day | ORAL | Status: DC
  Administered 2021-08-29 – 2021-09-01 (×4): 10 mg via ORAL
  Filled 2021-08-28 (×4): qty 2

## 2021-08-28 MED ORDER — HYDRALAZINE HCL 10 MG PO TABS
10.0000 mg | ORAL_TABLET | Freq: Four times a day (QID) | ORAL | Status: AC | PRN
  Filled 2021-08-28: qty 1

## 2021-08-28 MED ORDER — ACETAMINOPHEN 650 MG RE SUPP: 650.0000 mg | Freq: Four times a day (QID) | RECTAL | Status: AC | PRN

## 2021-08-28 MED ORDER — LACTATED RINGERS IV SOLN: INTRAVENOUS | Status: DC

## 2021-08-28 MED ORDER — APIXABAN 2.5 MG PO TABS
2.5000 mg | ORAL_TABLET | Freq: Two times a day (BID) | ORAL | Status: DC
  Administered 2021-08-29 – 2021-09-02 (×9): 2.5 mg via ORAL
  Filled 2021-08-28 (×11): qty 1

## 2021-08-28 MED ORDER — SODIUM CHLORIDE 0.9 % IV SOLN
2.0000 g | INTRAVENOUS | Status: DC
  Administered 2021-08-29: 2 g via INTRAVENOUS
  Filled 2021-08-28: qty 20

## 2021-08-28 MED ORDER — HYDROCODONE-ACETAMINOPHEN 5-325 MG PO TABS: 1.0000 | ORAL_TABLET | Freq: Four times a day (QID) | ORAL | Status: DC | PRN

## 2021-08-28 MED ORDER — SODIUM CHLORIDE 0.9 % IV SOLN
1.0000 g | Freq: Once | INTRAVENOUS | Status: AC
  Administered 2021-08-28: 1 g via INTRAVENOUS
  Filled 2021-08-28: qty 10

## 2021-08-28 MED ORDER — PANTOPRAZOLE SODIUM 40 MG PO TBEC
80.0000 mg | DELAYED_RELEASE_TABLET | Freq: Every day | ORAL | Status: DC
  Administered 2021-08-29 – 2021-09-02 (×5): 80 mg via ORAL
  Filled 2021-08-28 (×5): qty 2

## 2021-08-28 MED ORDER — ACETAMINOPHEN 325 MG PO TABS: 650.0000 mg | ORAL_TABLET | Freq: Four times a day (QID) | ORAL | Status: AC | PRN

## 2021-08-28 MED ORDER — ONDANSETRON HCL 4 MG PO TABS: 4.0000 mg | ORAL_TABLET | Freq: Four times a day (QID) | ORAL | Status: DC | PRN

## 2021-08-28 MED ORDER — ONDANSETRON HCL 4 MG/2ML IJ SOLN
4.0000 mg | Freq: Four times a day (QID) | INTRAMUSCULAR | Status: DC | PRN
  Administered 2021-08-29: 4 mg via INTRAVENOUS

## 2021-08-28 MED ORDER — METOPROLOL SUCCINATE ER 100 MG PO TB24
100.0000 mg | ORAL_TABLET | Freq: Every day | ORAL | Status: DC
  Administered 2021-08-29 – 2021-09-02 (×5): 100 mg via ORAL
  Filled 2021-08-28 (×2): qty 1
  Filled 2021-08-28: qty 2
  Filled 2021-08-28 (×2): qty 1

## 2021-08-28 MED ORDER — PRAVASTATIN SODIUM 40 MG PO TABS
40.0000 mg | ORAL_TABLET | Freq: Every day | ORAL | Status: DC
  Administered 2021-08-29 – 2021-09-01 (×4): 40 mg via ORAL
  Filled 2021-08-28 (×4): qty 1

## 2021-08-28 NOTE — Assessment & Plan Note (Signed)
PPI ?

## 2021-08-28 NOTE — Assessment & Plan Note (Signed)
-   Pravastatin 40 mg nightly resumed 

## 2021-08-28 NOTE — Consult Note (Signed)
CODE SEPSIS - PHARMACY COMMUNICATION  **Broad Spectrum Antibiotics should be administered within 1 hour of Sepsis diagnosis**  Time Code Sepsis Called/Page Received: 2120  Antibiotics Ordered: 2000  Time of 1st antibiotic administration: 2026  Additional action taken by pharmacy: N/A  If necessary, Name of Provider/Nurse Contacted: N/A  Lorna Dibble ,PharmD Clinical Pharmacist  08/28/2021  9:26 PM

## 2021-08-28 NOTE — Sepsis Progress Note (Signed)
Following per sepsis protocol   

## 2021-08-28 NOTE — Assessment & Plan Note (Signed)
-   Resumed home metoprolol succinate 100 mg daily - Per med reconciliation by pharmacy tech, patient is no longer taking amlodipine 2.5 mg daily, lisinopril 5 mg daily, verapamil 180 mg daily; therefore these have not been resumed - Hydralazine 10 mg p.o. every 6 hours as needed for SBP greater than 180, 3 days ordered

## 2021-08-28 NOTE — Assessment & Plan Note (Signed)
At baseline 

## 2021-08-28 NOTE — Assessment & Plan Note (Addendum)
-   Etiology work-up in progress - Possible cellulitis, cellulitis order set utilized - Continue ceftriaxone 2 g IV daily starting on 08/29/2021, 6 additional dose ordered to complete 7-dose course - Right upper extremity ultrasound to assess for DVT ordered - MRI of the left upper extremity with contrast ordered

## 2021-08-28 NOTE — ED Provider Notes (Signed)
[facility] Provider Note    Event Date/Time   First MD Initiated Contact with Patient 08/28/21 1949     (approximate)   History   Wound Infection and Anorexia   HPI  Jose Baker is a 81 y.o. male with history of arthritis, hypertension, cerebral aneurysm without rupture, atrial fibrillation on Eliquis and as listed in EMR presents to the emergency department for evaluation of left forearm pain and swelling. Symptoms started about a week ago. No known injury. He has also had a decrease in appetite. No known fever.      Physical Exam   Triage Vital Signs: ED Triage Vitals  Enc Vitals Group     BP 08/28/21 1919 (!) 143/97     Pulse Rate 08/28/21 1919 79     Resp 08/28/21 1919 16     Temp 08/28/21 1919 98 F (36.7 C)     Temp Source 08/28/21 1919 Oral     SpO2 08/28/21 1919 99 %     Weight 08/28/21 1920 175 lb (79.4 kg)     Height 08/28/21 1920 5\' 7"  (1.702 m)     Head Circumference --      Peak Flow --      Pain Score 08/28/21 1920 7     Pain Loc --      Pain Edu? --      Excl. in [place]? --     Most recent vital signs: Vitals:   08/28/21 2030 08/28/21 2130  BP: 120/76 (!) 132/95  Pulse: 71 (!) 106  Resp: (!) 23 (!) 23  Temp:    SpO2: 95% 99%    General: Awake, no distress. Disoriented to time--baseline per family. CV:  Good peripheral perfusion.  Resp:  Normal effort.  Abd:  No distention.  Other:  Dorsal aspect of left forearm erythematous with weeping vesicles and lymphangitis extending toward axilla.   ED Results / Procedures / Treatments   Labs (all labs ordered are listed, but only abnormal results are displayed) Labs Reviewed  COMPREHENSIVE METABOLIC PANEL - Abnormal; Notable for the following components:      Result Value   Glucose, Bld 114 (*)    Creatinine, Ser 1.87 (*)    Total Bilirubin 1.3 (*)    GFR, Estimated 36 (*)    All other components within normal limits  LACTIC ACID, PLASMA - Abnormal; Notable for the  following components:   Lactic Acid, Venous 2.1 (*)    All other components within normal limits  CBC WITH DIFFERENTIAL/PLATELET - Abnormal; Notable for the following components:   WBC 20.3 (*)    Hemoglobin 12.3 (*)    MCH 25.2 (*)    MCHC 29.4 (*)    RDW 16.4 (*)    Neutro Abs 18.5 (*)    Lymphs Abs 0.4 (*)    Monocytes Absolute 1.1 (*)    Abs Immature Granulocytes 0.18 (*)    All other components within normal limits  TROPONIN I (HIGH SENSITIVITY) - Abnormal; Notable for the following components:   Troponin I (High Sensitivity) 20 (*)    All other components within normal limits  CULTURE, BLOOD (ROUTINE X 2)  CULTURE, BLOOD (ROUTINE X 2)  RESP PANEL BY RT-PCR (FLU A&B, COVID) ARPGX2  LACTIC ACID, PLASMA  URINALYSIS, ROUTINE W REFLEX MICROSCOPIC  PROTIME-INR  APTT  TROPONIN I (HIGH SENSITIVITY)     EKG  ED ECG REPORT I, Dezhane Staten, FNP-BC personally viewed and interpreted this ECG.  Date: 08/28/2021  EKG Time: 1931  Rate: 71  Rhythm: atrial fibrillation, rate controlled  Axis: normal  Intervals:none  ST&T Change: none    RADIOLOGY  Image and radiology report reviewed by me.  Image of the left forearm shows no bony abnormality, soft tissue gas, or retained foreign body.  PROCEDURES:  Critical Care performed: Yes, see critical care procedure note(s)  Procedures  CRITICAL CARE Performed by: Sherrie George   Total critical care time: 45 minutes  Critical care time was exclusive of separately billable procedures and treating other patients.  Critical care was necessary to treat or prevent imminent or life-threatening deterioration.  Critical care was time spent personally by me on the following activities: development of treatment plan with patient and/or surrogate as well as nursing, discussions with consultants, evaluation of patient's response to treatment, examination of patient, obtaining history from patient or surrogate, ordering and performing  treatments and interventions, ordering and review of laboratory studies, ordering and review of radiographic studies, pulse oximetry and re-evaluation of patient's condition.   MEDICATIONS ORDERED IN ED: Medications  Tdap (BOOSTRIX) injection 0.5 mL (has no administration in time range)  lactated ringers infusion (has no administration in time range)  cefTRIAXone (ROCEPHIN) 1 g in sodium chloride 0.9 % 100 mL IVPB (0 g Intravenous Stopped 08/28/21 2056)  cefTRIAXone (ROCEPHIN) 1 g in sodium chloride 0.9 % 100 mL IVPB (1 g Intravenous New Bag/Given 08/28/21 2201)     IMPRESSION / MDM / ASSESSMENT AND PLAN / ED COURSE  I reviewed the triage vital signs and the nursing notes.                              Differential diagnosis includes, but is not limited to, Cellulitis, sepsis   The patient is on the cardiac monitor to evaluate for evidence of arrhythmia and/or significant heart rate changes.  81 year old male presenting to the emergency department for treatment and evaluation of left arm pain and swelling.  He has also had decrease in appetite.  See HPI for further details.  On exam, he has vesicles that are weeping with lymphangitis that goes from the dorsal forearm and up into the bicep area toward the axilla.  Patient has no focal erythema or tenderness overlying the elbow.  There is no edema or erythema over the olecranon.  Patient is able to fully extend and flex the arm.  No sign of septic joint.  Patient is likely septic as he has a lactic acid of 2.1, heart rate of 106, respiratory rate of 23.  He is afebrile but he does have a white blood cell count of 20.3 with a left shift.  CMP shows a bump in his creatinine to 1.87.  This is near patient's baseline.  Chest x-ray is without acute concerns.  EKG is unchanged.  He is in a controlled A. fib.  He does take Eliquis.  Fluids and Rocephin ordered.  Plan will be to admit him via the hospitalist services.   Dr. Tobie Poet accepts for  admission.     FINAL CLINICAL IMPRESSION(S) / ED DIAGNOSES   Final diagnoses:  Sepsis without acute organ dysfunction, due to unspecified organism ([place])  Cellulitis of left upper extremity     Rx / DC Orders   ED Discharge Orders     None        Note:  This document was prepared using Dragon voice recognition software and may include  unintentional dictation errors.   Victorino Dike, FNP 08/28/21 2232    Lucrezia Starch, MD 08/28/21 [phone]

## 2021-08-28 NOTE — ED Triage Notes (Signed)
Pt brought to the ER by son in law, pt started shaking today and has had a loss of appetite and noticed that his left forearm red and swollen without any known injury

## 2021-08-28 NOTE — ED Notes (Signed)
Pts L FA is red, warm to touch, weeping; no known injury. Pt also just doesn't feel well today & his appetite is decreased today.

## 2021-08-28 NOTE — ED Notes (Signed)
This RN notified by lab of critical lactic acid 2.1; Dr. Tamala Julian aware

## 2021-08-28 NOTE — H&P (Signed)
History and Physical   Jose Baker HYI:[id number] DOB: [birth year] DOA: 08/28/2021  PCP: Sofie Hartigan, MD  Outpatient Specialists: Dr. Volney American, GI Patient coming from: Home  I have personally briefly reviewed patient's old medical records in [place].  Chief Concern: Left arm pain and swelling  HPI: 81 year old male with no diagnosis of dementia, chronic baseline cognitive decline, chronic baseline memory difficulty, hypertension, hyperlipidemia, history of atrial fibrillation, compliant with anticoagulation, OSA, GERD, osteoarthritis, who presents emergency department for chief concerns of left upper extremity swelling and pain.  Vitals in the emergency department showed temperature of 98, respiration rate of 20, heart rate of 81, blood pressure 120/78, SPO2 of 94% on room air.  Serum sodium 138, potassium 3.9, chloride 103, bicarb of 22, BUN of 21, serum creatinine of 1.87, nonfasting blood glucose 114, EGFR 36.  High sensitive troponin was 20.  Lactic acid was 2.1.  WBC was elevated at 20.3, hemoglobin 12.3, platelets of 153.  In the emergency department patient was given a total ceftriaxone 2 g IV.  He was also started on lactated ringer IVF at 150 mL/h.  Portable chest x-ray was ordered and read as no active cardiopulmonary disease.  EDP also ordered x-ray of the left forearm which was read as proximal soft tissue swelling.  No acute bony abnormality.  At bedside, patient was able to tell me his name. He was not able to tell me his age. He states he is 'I am here'. He denies he is in the hospital. No family was at bedside.  Odor is present indicating poor hygiene.  Does not appear to be in acute distress.  He could not provide any history or answer any questions in any meaningful way.  He does not appear to be altered, however I suspect this is undiagnosed dementia.  Social history: Unknown Vaccination history: Unknown  ROS: Unable to complete due to patient's  dementia/cognitive decline/memory decline  ED Course: Discussed with emergency medicine provider, patient requiring hospitalization for chief concerns of suspected left upper extremity cellulitis.  Assessment/Plan  Principal Problem:   Left arm swelling Active Problems:   Arthritis   GERD (gastroesophageal reflux disease)   Hyperlipidemia   Hypertension   OSA (obstructive sleep apnea)   Paroxysmal atrial fibrillation (HCC)   Stage 3b chronic kidney disease (CKD) (HCC)   Hypophosphatemia   # Patient met sepsis criteria - Increased lactic acid, source of infection, leukocytosis, increased respiration rate -Continue IV antibiotic - Continue LR 150 mL/h, 20 hours ordered - Patient is maintaining appropriate MAP at this time, no indications for IV fluid bolus - MRI of the left upper extremity/forearm with contrast ordered  * Left arm swelling Assessment & Plan - Etiology work-up in progress - Possible cellulitis, cellulitis order set utilized - Continue ceftriaxone 2 g IV daily starting on 08/29/2021, 6 additional dose ordered to complete 7-dose course - Right upper extremity ultrasound to assess for DVT ordered - MRI of the left upper extremity with contrast ordered  Hypertension Assessment & Plan - Resumed home metoprolol succinate 100 mg daily - Per med reconciliation by pharmacy tech, patient is no longer taking amlodipine 2.5 mg daily, lisinopril 5 mg daily, verapamil 180 mg daily; therefore these have not been resumed - Hydralazine 10 mg p.o. every 6 hours as needed for SBP greater than 180, 3 days ordered   GERD (gastroesophageal reflux disease) Assessment & Plan - PPI  Stage 3b chronic kidney disease (CKD) ([place]) Assessment & Plan -  At baseline  Other Hyperlipidemia Assessment & Plan - Pravastatin 40 mg nightly resumed  # Hypophosphatemia-IV phosphorus ordered - A.m. phosphorus lab  Chart reviewed.   DVT prophylaxis: Apixaban Code Status: Full code Diet:  Heart healthy Family Communication: Attempted to call family at [phone] and [phone], no pick up of both numbers Disposition Plan: Pending clinical course Consults called: None at this time Admission status: Telemetry cardiac, observation  Past Medical History:  Diagnosis Date   Arrhythmia    HTN (hypertension)    Hypercholesteremia    Obesity    Sleep apnea    Past Surgical History:  Procedure Laterality Date   BRAIN SURGERY     CATARACT EXTRACTION     Social History:  reports that he has never smoked. He has never used smokeless tobacco. He reports that he does not drink alcohol and does not use drugs.  Allergies  Allergen Reactions   Lipitor [Atorvastatin Calcium]    Family History  Problem Relation Age of Onset   Alzheimer's disease Other    Hypertension Other    Prostate cancer Neg Hx    Bladder Cancer Neg Hx    Kidney cancer Neg Hx    Family history: Family history reviewed and not pertinent.  Prior to Admission medications   Medication Sig Start Date End Date Taking? Authorizing Provider  ELIQUIS 2.5 MG TABS tablet Take 2.5 mg by mouth 2 (two) times daily. 08/28/21  Yes [provider]  febuxostat (ULORIC) 40 MG tablet Take 40 mg by mouth daily. 12/11/20  Yes [provider]  Melatonin 3 MG TABS Take 10 mg by mouth at bedtime. 08/20/16  Yes [provider]  metoprolol succinate (TOPROL-XL) 100 MG 24 hr tablet Take 100 mg by mouth daily. 06/15/17  Yes [provider]  omeprazole (PRILOSEC) 20 MG capsule 40 mg daily. 02/08/16  Yes [provider]  pravastatin (PRAVACHOL) 40 MG tablet SMARTSIG:1 Tablet(s) By Mouth Every Evening 07/29/21  Yes [provider]  amLODipine (NORVASC) 2.5 MG tablet Take 2.5 mg by mouth daily. Patient not taking: Reported on 08/28/2021 06/07/17   [provider]  b complex vitamins tablet Take 1 tablet by mouth daily. Patient not taking: Reported on 08/28/2021    [provider]  Calcium Carbonate-Vitamin D (CALCIUM 500 + D PO) Take by mouth daily. Patient not taking: Reported on 08/28/2021    [provider]  Cholecalciferol (VITAMIN D3) 2000 UNITS TABS Take by mouth daily. Patient not taking: Reported on 08/28/2021    [provider]  finasteride (PROSCAR) 5 MG tablet Take 1 tablet (5 mg total) by mouth daily. Patient not taking: Reported on 08/28/2021 09/03/19   Hollice Espy, MD  fish oil-omega-3 fatty acids 1000 MG capsule Take 2 g by mouth daily. Patient not taking: Reported on 08/28/2021    [provider]  HYDROcodone-acetaminophen (NORCO) 5-325 MG tablet Take 1 tablet by mouth every 6 (six) hours as needed for moderate pain. Patient not taking: Reported on 08/28/2021 02/01/20   Paulette Blanch, MD  lisinopril (ZESTRIL) 5 MG tablet Take 5 mg by mouth daily. Patient not taking: Reported on 08/28/2021 07/28/19   [provider]  ondansetron (ZOFRAN ODT) 4 MG disintegrating tablet Take 1 tablet (4 mg total) by mouth every 8 (eight) hours as needed for nausea or vomiting. Patient not taking: Reported on 08/28/2021 02/01/20   Paulette Blanch, MD  QUEtiapine (SEROQUEL) 50 MG tablet Take by mouth. Patient not taking: Reported on  08/28/2021 08/20/16   [provider]  Selenium 200 MCG CAPS Take by mouth daily. Patient not taking: Reported on 08/28/2021    [provider]  verapamil (CALAN-SR) 180 MG CR tablet Take 180 mg by mouth at bedtime. Patient not taking: Reported on 08/28/2021    [provider]   Physical Exam: Vitals:   08/28/21 2130 08/28/21 2200 08/28/21 2215 08/28/21 2300  BP: (!) 132/95 (!) 152/96  140/90  Pulse: (!) 106 66 75 86  Resp: (!) '23  20 19  ' Temp:      TempSrc:      SpO2: 99% 100% 96% 93%  Weight:      Height:       Constitutional: appears age-appropriate, frail, NAD, calm, comfortable Eyes: PERRL, lids and conjunctivae normal ENMT: Mucous membranes are moist. Posterior  pharynx clear of any exudate or lesions. Age-appropriate dentition. Hearing appropriate Neck: normal, supple, no masses, no thyromegaly Respiratory: clear to auscultation bilaterally, no wheezing, no crackles. Normal respiratory effort. No accessory muscle use.  Cardiovascular: Regular rate and rhythm, no murmurs / rubs / gallops. No extremity edema. 2+ pedal pulses. No carotid bruits.  Abdomen: Morbidly obese abdomen, no tenderness, no masses palpated, no hepatosplenomegaly. Bowel sounds positive.  Musculoskeletal: no clubbing / cyanosis. No joint deformity upper and lower extremities. Good ROM, no contractures, no atrophy. Normal muscle tone.  Skin: + rash/wounds    Neurologic: Sensation intact. Strength 5/5 in all 4.  Psychiatric: Normal judgment and insight. Alert and oriented x 3. Normal mood.   EKG: independently reviewed, showing atrial fibrillation with rate of 71, QTc 458.  Chest x-ray on Admission: I personally reviewed and I agree with radiologist reading as below.  DG Chest 2 View  Result Date: 08/28/2021 CLINICAL DATA:  Altered mental status EXAM: CHEST - 2 VIEW COMPARISON:  Chest x-ray dated June 28, 2020 FINDINGS: Cardiac and mediastinal contours are unchanged. Large hiatal hernia. Bibasilar atelectasis. Lungs otherwise clear. Inferior costophrenic angles are excluded from the field of view, no large pleural effusion. No evidence of pneumothorax. IMPRESSION: No active cardiopulmonary disease. Electronically Signed   By: Yetta Glassman M.D.   On: 08/28/2021 19:54   DG Forearm Left  Result Date: 08/28/2021 CLINICAL DATA:  Left forearm swelling, erythema EXAM: LEFT FOREARM - 2 VIEW COMPARISON:  None. FINDINGS: Frontal and lateral views of the left forearm are obtained. No acute or destructive bony lesions. Alignment of the left elbow and wrist is anatomic. Marked soft tissue swelling of the proximal forearm. No subcutaneous gas or radiopaque foreign body. IMPRESSION: 1.  Proximal soft tissue swelling. 2. No acute bony abnormality. Electronically Signed   By: Randa Ngo M.D.   On: 08/28/2021 20:48   US Venous Img Upper Uni Left (DVT)  Result Date: 08/29/2021 CLINICAL DATA:  Left arm swelling EXAM: LEFT UPPER EXTREMITY VENOUS DOPPLER ULTRASOUND TECHNIQUE: Gray-scale sonography with graded compression, as well as color Doppler and duplex ultrasound were performed to evaluate the upper extremity deep venous system from the level of the subclavian vein and including the jugular, axillary, basilic, radial, ulnar and upper cephalic vein. Spectral Doppler was utilized to evaluate flow at rest and with distal augmentation maneuvers. COMPARISON:  None. FINDINGS: Contralateral Subclavian Vein: Respiratory phasicity is normal and symmetric with the symptomatic side. No evidence of thrombus. Normal compressibility. Internal Jugular Vein: No evidence of thrombus. Normal compressibility, respiratory phasicity and response to augmentation. Subclavian Vein: No evidence of thrombus. Normal compressibility, respiratory phasicity and response to augmentation.  Axillary Vein: No evidence of thrombus. Normal compressibility, respiratory phasicity and response to augmentation. Cephalic Vein: No evidence of thrombus. Normal compressibility, respiratory phasicity and response to augmentation. Basilic Vein: No evidence of thrombus. Normal compressibility, respiratory phasicity and response to augmentation. Brachial Veins: No evidence of thrombus. Normal compressibility, respiratory phasicity and response to augmentation. Radial Veins: No evidence of thrombus. Normal compressibility, respiratory phasicity and response to augmentation. Ulnar Veins: No evidence of thrombus. Normal compressibility, respiratory phasicity and response to augmentation. Venous Reflux:  None visualized. Other Findings:  None visualized. IMPRESSION: No evidence of DVT within the left upper extremity. Electronically Signed   By:  Julian Hy M.D.   On: 08/29/2021 00:21    Labs on Admission: I have personally reviewed following labs  CBC: Recent Labs  Lab 08/28/21 1924  WBC 20.3*  NEUTROABS 18.5*  HGB 12.3*  HCT 41.8  MCV 85.5  PLT 754   Basic Metabolic Panel: Recent Labs  Lab 08/28/21 1924 08/28/21 2208  NA 138  --   K 3.9  --   CL 103  --   CO2 22  --   GLUCOSE 114*  --   BUN 21  --   CREATININE 1.87*  --   CALCIUM 9.3  --   PHOS  --  2.3*   GFR: Estimated Creatinine Clearance: 31.3 mL/min (A) (by C-G formula based on SCr of 1.87 mg/dL (H)).  Liver Function Tests: Recent Labs  Lab 08/28/21 1924  AST 36  ALT 21  ALKPHOS 78  BILITOT 1.3*  PROT 7.3  ALBUMIN 4.1   Coagulation Profile: Recent Labs  Lab 08/28/21 2208  INR 1.5*   Urine analysis:    Component Value Date/Time   COLORURINE YELLOW (A) 03/20/2016 1935   APPEARANCEUR CLEAR (A) 03/20/2016 1935   LABSPEC 1.021 03/20/2016 1935   PHURINE 6.0 03/20/2016 1935   GLUCOSEU NEGATIVE 03/20/2016 1935   HGBUR NEGATIVE 03/20/2016 1935   BILIRUBINUR NEGATIVE 03/20/2016 [place] NEGATIVE 03/20/2016 1935   PROTEINUR 100 (A) 03/20/2016 1935   NITRITE NEGATIVE 03/20/2016 1935   LEUKOCYTESUR NEGATIVE 03/20/2016 1935   CRITICAL CARE Performed by: Briant Cedar Chamar Broughton  Total critical care time: 30 minutes  Critical care time was exclusive of separately billable procedures and treating other patients.  Critical care was necessary to treat or prevent imminent or life-threatening deterioration.  Critical care was time spent personally by me on the following activities: development of treatment plan with patient and/or surrogate as well as nursing, discussions with consultants, evaluation of patient's response to treatment, examination of patient, obtaining history from patient or surrogate, ordering and performing treatments and interventions, ordering and review of laboratory studies, ordering and review of radiographic studies, pulse  oximetry and re-evaluation of patient's condition.  Dr. Tobie Poet [facility]  If 7PM-7AM, please contact overnight-coverage provider If 7AM-7PM, please contact day coverage provider [email or web address]  08/29/2021, 1:19 AM

## 2021-08-28 NOTE — Hospital Course (Signed)
81 year old male with no diagnosis of dementia, chronic baseline cognitive decline, chronic baseline memory difficulty, hypertension, hyperlipidemia, history of atrial fibrillation, compliant with anticoagulation, OSA, GERD, osteoarthritis, who presents emergency department for chief concerns of left upper extremity swelling and pain.  Vitals in the emergency department showed temperature of 98, respiration rate of 20, heart rate of 81, blood pressure 120/78, SPO2 of 94% on room air.  Serum sodium 138, potassium 3.9, chloride 103, bicarb of 22, BUN of 21, serum creatinine of 1.87, nonfasting blood glucose 114, EGFR 36.  High sensitive troponin was 20.  Lactic acid was 2.1.  WBC was elevated at 20.3, hemoglobin 12.3, platelets of 153.  In the emergency department patient was given a total ceftriaxone 2 g IV.  He was also started on lactated ringer IVF at 150 mL/h.  Portable chest x-ray was ordered and read as no active cardiopulmonary disease.  EDP also ordered x-ray of the left forearm which was read as proximal soft tissue swelling.  No acute bony abnormality.

## 2021-08-29 ENCOUNTER — Observation Stay: Payer: Medicare Other

## 2021-08-29 ENCOUNTER — Encounter
Admission: EM | Disposition: A | Discharge status: Home / Self Care | Payer: Self-pay | Source: Home / Self Care | Attending: Family Medicine

## 2021-08-29 ENCOUNTER — Other Ambulatory Visit: Payer: Self-pay

## 2021-08-29 ENCOUNTER — Observation Stay: Payer: Medicare Other | Admitting: Anesthesiology

## 2021-08-29 ENCOUNTER — Encounter: Payer: Self-pay | Admitting: Internal Medicine

## 2021-08-29 DIAGNOSIS — Z888 Allergy status to other drugs, medicaments and biological substances status: Secondary | ICD-10-CM | POA: Diagnosis not present

## 2021-08-29 DIAGNOSIS — A491 Streptococcal infection, unspecified site: Secondary | ICD-10-CM

## 2021-08-29 DIAGNOSIS — M1A9XX0 Chronic gout, unspecified, without tophus (tophi): Secondary | ICD-10-CM | POA: Diagnosis present

## 2021-08-29 DIAGNOSIS — M7989 Other specified soft tissue disorders: Secondary | ICD-10-CM | POA: Diagnosis not present

## 2021-08-29 DIAGNOSIS — I96 Gangrene, not elsewhere classified: Secondary | ICD-10-CM | POA: Diagnosis not present

## 2021-08-29 DIAGNOSIS — Z6827 Body mass index (BMI) 27.0-27.9, adult: Secondary | ICD-10-CM | POA: Diagnosis not present

## 2021-08-29 DIAGNOSIS — R7881 Bacteremia: Secondary | ICD-10-CM

## 2021-08-29 DIAGNOSIS — H918X3 Other specified hearing loss, bilateral: Secondary | ICD-10-CM | POA: Diagnosis present

## 2021-08-29 DIAGNOSIS — I480 Paroxysmal atrial fibrillation: Secondary | ICD-10-CM | POA: Diagnosis present

## 2021-08-29 DIAGNOSIS — N1832 Chronic kidney disease, stage 3b: Secondary | ICD-10-CM | POA: Diagnosis present

## 2021-08-29 DIAGNOSIS — Z20822 Contact with and (suspected) exposure to covid-19: Secondary | ICD-10-CM | POA: Diagnosis present

## 2021-08-29 DIAGNOSIS — E7800 Pure hypercholesterolemia, unspecified: Secondary | ICD-10-CM | POA: Diagnosis present

## 2021-08-29 DIAGNOSIS — Z23 Encounter for immunization: Secondary | ICD-10-CM | POA: Diagnosis present

## 2021-08-29 DIAGNOSIS — N179 Acute kidney failure, unspecified: Secondary | ICD-10-CM | POA: Diagnosis not present

## 2021-08-29 DIAGNOSIS — A419 Sepsis, unspecified organism: Secondary | ICD-10-CM | POA: Diagnosis not present

## 2021-08-29 DIAGNOSIS — F03A0 Unspecified dementia, mild, without behavioral disturbance, psychotic disturbance, mood disturbance, and anxiety: Secondary | ICD-10-CM | POA: Diagnosis present

## 2021-08-29 DIAGNOSIS — Z820 Family history of epilepsy and other diseases of the nervous system: Secondary | ICD-10-CM | POA: Diagnosis not present

## 2021-08-29 DIAGNOSIS — I129 Hypertensive chronic kidney disease with stage 1 through stage 4 chronic kidney disease, or unspecified chronic kidney disease: Secondary | ICD-10-CM | POA: Diagnosis present

## 2021-08-29 DIAGNOSIS — E669 Obesity, unspecified: Secondary | ICD-10-CM | POA: Diagnosis present

## 2021-08-29 DIAGNOSIS — M1990 Unspecified osteoarthritis, unspecified site: Secondary | ICD-10-CM | POA: Diagnosis present

## 2021-08-29 DIAGNOSIS — N320 Bladder-neck obstruction: Secondary | ICD-10-CM | POA: Diagnosis present

## 2021-08-29 DIAGNOSIS — Z7901 Long term (current) use of anticoagulants: Secondary | ICD-10-CM | POA: Diagnosis not present

## 2021-08-29 DIAGNOSIS — B955 Unspecified streptococcus as the cause of diseases classified elsewhere: Secondary | ICD-10-CM | POA: Diagnosis not present

## 2021-08-29 DIAGNOSIS — A409 Streptococcal sepsis, unspecified: Secondary | ICD-10-CM | POA: Diagnosis not present

## 2021-08-29 DIAGNOSIS — G4733 Obstructive sleep apnea (adult) (pediatric): Secondary | ICD-10-CM | POA: Diagnosis present

## 2021-08-29 DIAGNOSIS — A400 Sepsis due to streptococcus, group A: Secondary | ICD-10-CM | POA: Diagnosis present

## 2021-08-29 DIAGNOSIS — R6520 Severe sepsis without septic shock: Secondary | ICD-10-CM | POA: Diagnosis not present

## 2021-08-29 DIAGNOSIS — L03114 Cellulitis of left upper limb: Secondary | ICD-10-CM

## 2021-08-29 DIAGNOSIS — E872 Acidosis, unspecified: Secondary | ICD-10-CM | POA: Diagnosis present

## 2021-08-29 DIAGNOSIS — I428 Other cardiomyopathies: Secondary | ICD-10-CM | POA: Diagnosis present

## 2021-08-29 DIAGNOSIS — K219 Gastro-esophageal reflux disease without esophagitis: Secondary | ICD-10-CM | POA: Diagnosis present

## 2021-08-29 HISTORY — PX: INCISION AND DRAINAGE ABSCESS: SHX5864

## 2021-08-29 LAB — BLOOD CULTURE ID PANEL (REFLEXED) - BCID2

## 2021-08-29 LAB — BASIC METABOLIC PANEL
Anion gap: 10 (ref 5–15)
BUN: 26 mg/dL — ABNORMAL HIGH (ref 8–23)
CO2: 23 mmol/L (ref 22–32)
Calcium: 8.6 mg/dL — ABNORMAL LOW (ref 8.9–10.3)
Chloride: 101 mmol/L (ref 98–111)
Creatinine, Ser: 1.91 mg/dL — ABNORMAL HIGH (ref 0.61–1.24)
GFR, Estimated: 35 mL/min — ABNORMAL LOW (ref >60)
Glucose, Bld: 121 mg/dL — ABNORMAL HIGH (ref 70–99)
Potassium: 3.8 mmol/L (ref 3.5–5.1)
Sodium: 134 mmol/L — ABNORMAL LOW (ref 135–145)

## 2021-08-29 LAB — PHOSPHORUS
Phosphorus: 2.3 mg/dL — ABNORMAL LOW (ref 2.5–4.6)
Phosphorus: 3.5 mg/dL (ref 2.5–4.6)

## 2021-08-29 LAB — CBC
HCT: 35.6 % — ABNORMAL LOW (ref 39.0–52.0)
Hemoglobin: 10.8 g/dL — ABNORMAL LOW (ref 13.0–17.0)
MCH: 25.9 pg — ABNORMAL LOW (ref 26.0–34.0)
MCHC: 30.3 g/dL (ref 30.0–36.0)
MCV: 85.4 fL (ref 80.0–100.0)
Platelets: 124 10*3/uL — ABNORMAL LOW (ref 150–400)
RBC: 4.17 MIL/uL — ABNORMAL LOW (ref 4.22–5.81)
RDW: 16.7 % — ABNORMAL HIGH (ref 11.5–15.5)
WBC: 20.9 10*3/uL — ABNORMAL HIGH (ref 4.0–10.5)
nRBC: 0 % (ref 0.0–0.2)

## 2021-08-29 LAB — CBG MONITORING, ED: Glucose-Capillary: 101 mg/dL — ABNORMAL HIGH (ref 70–99)

## 2021-08-29 LAB — LACTIC ACID, PLASMA
Lactic Acid, Venous: 2.3 mmol/L (ref 0.5–1.9)
Lactic Acid, Venous: 2.6 mmol/L (ref 0.5–1.9)

## 2021-08-29 SURGERY — INCISION AND DRAINAGE, ABSCESS
Anesthesia: General | Laterality: Left

## 2021-08-29 MED ORDER — PROPOFOL 10 MG/ML IV BOLUS
INTRAVENOUS | Status: AC
  Filled 2021-08-29: qty 20

## 2021-08-29 MED ORDER — FENTANYL CITRATE (PF) 100 MCG/2ML IJ SOLN
INTRAMUSCULAR | Status: DC | PRN
Start: 2021-08-29 — End: 2021-08-29
  Administered 2021-08-29: 25 ug via INTRAVENOUS
  Administered 2021-08-29: 50 ug via INTRAVENOUS
  Administered 2021-08-29: 25 ug via INTRAVENOUS

## 2021-08-29 MED ORDER — PENICILLIN G POT IN DEXTROSE 60000 UNIT/ML IV SOLN
3.0000 10*6.[IU] | INTRAVENOUS | Status: DC
  Administered 2021-08-30 – 2021-08-31 (×8): 3 10*6.[IU] via INTRAVENOUS
  Filled 2021-08-29 (×14): qty 50

## 2021-08-29 MED ORDER — LIDOCAINE HCL (PF) 2 % IJ SOLN
INTRAMUSCULAR | Status: AC
  Filled 2021-08-29: qty 5

## 2021-08-29 MED ORDER — SILVER SULFADIAZINE 1 % EX CREA
TOPICAL_CREAM | CUTANEOUS | Status: DC | PRN
  Administered 2021-08-29: 1 via TOPICAL

## 2021-08-29 MED ORDER — SODIUM PHOSPHATES 45 MMOLE/15ML IV SOLN
15.0000 mmol | Freq: Once | INTRAVENOUS | Status: AC
  Administered 2021-08-29: 15 mmol via INTRAVENOUS
  Filled 2021-08-29 (×2): qty 5

## 2021-08-29 MED ORDER — SILVER SULFADIAZINE 1 % EX CREA
TOPICAL_CREAM | CUTANEOUS | Status: AC
  Filled 2021-08-29: qty 85

## 2021-08-29 MED ORDER — VANCOMYCIN HCL 1750 MG/350ML IV SOLN
1750.0000 mg | Freq: Once | INTRAVENOUS | Status: AC
  Administered 2021-08-29: 1750 mg via INTRAVENOUS
  Filled 2021-08-29: qty 350

## 2021-08-29 MED ORDER — LACTATED RINGERS IV SOLN: INTRAVENOUS | Status: DC

## 2021-08-29 MED ORDER — PROPOFOL 10 MG/ML IV BOLUS
INTRAVENOUS | Status: DC | PRN
  Administered 2021-08-29: 120 mg via INTRAVENOUS
  Administered 2021-08-29: 80 mg via INTRAVENOUS

## 2021-08-29 MED ORDER — FENTANYL CITRATE (PF) 100 MCG/2ML IJ SOLN
INTRAMUSCULAR | Status: AC
  Filled 2021-08-29: qty 2

## 2021-08-29 MED ORDER — SUCCINYLCHOLINE CHLORIDE 200 MG/10ML IV SOSY
PREFILLED_SYRINGE | INTRAVENOUS | Status: DC | PRN
  Administered 2021-08-29: 120 mg via INTRAVENOUS

## 2021-08-29 MED ORDER — CLINDAMYCIN PHOSPHATE 900 MG/50ML IV SOLN
INTRAVENOUS | Status: AC
  Filled 2021-08-29: qty 50

## 2021-08-29 MED ORDER — LIDOCAINE HCL (CARDIAC) PF 100 MG/5ML IV SOSY
PREFILLED_SYRINGE | INTRAVENOUS | Status: DC | PRN
  Administered 2021-08-29: 100 mg via INTRAVENOUS

## 2021-08-29 MED ORDER — ONDANSETRON HCL 4 MG/2ML IJ SOLN
INTRAMUSCULAR | Status: AC
  Filled 2021-08-29: qty 2

## 2021-08-29 MED ORDER — CLINDAMYCIN PHOSPHATE 900 MG/50ML IV SOLN
900.0000 mg | Freq: Three times a day (TID) | INTRAVENOUS | Status: AC
  Administered 2021-08-29 – 2021-09-01 (×11): 900 mg via INTRAVENOUS
  Filled 2021-08-29 (×11): qty 50

## 2021-08-29 MED ORDER — SODIUM CHLORIDE FLUSH 0.9 % IV SOLN
INTRAVENOUS | Status: AC
  Filled 2021-08-29: qty 10

## 2021-08-29 MED ORDER — VANCOMYCIN HCL 1500 MG/300ML IV SOLN: 1500.0000 mg | INTRAVENOUS | Status: DC

## 2021-08-29 MED ORDER — FENTANYL CITRATE (PF) 100 MCG/2ML IJ SOLN: 25.0000 ug | INTRAMUSCULAR | Status: DC | PRN

## 2021-08-29 MED ORDER — LACTATED RINGERS IV BOLUS
1000.0000 mL | Freq: Once | INTRAVENOUS | Status: AC
  Administered 2021-08-29: 1000 mL via INTRAVENOUS

## 2021-08-29 MED ORDER — IOHEXOL 350 MG/ML SOLN
75.0000 mL | Freq: Once | INTRAVENOUS | Status: AC | PRN
Start: 2021-08-29 — End: 2021-08-29
  Administered 2021-08-29: 75 mL via INTRAVENOUS
  Filled 2021-08-29: qty 75

## 2021-08-29 MED ORDER — SODIUM CHLORIDE 0.9 % IV SOLN: 2.0000 g | INTRAVENOUS | Status: DC

## 2021-08-29 SURGICAL SUPPLY — 31 items
BLADE SURG 15 STRL LF DISP TIS (BLADE) ×1 IMPLANT
BLADE SURG 15 STRL SS (BLADE) ×1
BNDG GAUZE ELAST 4 BULKY (GAUZE/BANDAGES/DRESSINGS) ×1 IMPLANT
DRAIN PENROSE 12X.25 LTX STRL (MISCELLANEOUS) IMPLANT
DRAIN PENROSE 5/8X18 LTX STRL (DRAIN) IMPLANT
DRAPE LAPAROTOMY 77X122 PED (DRAPES) ×2 IMPLANT
ELECT CAUTERY BLADE 6.4 (BLADE) ×2 IMPLANT
ELECT REM PT RETURN 9FT ADLT (ELECTROSURGICAL) ×2
ELECTRODE REM PT RTRN 9FT ADLT (ELECTROSURGICAL) ×1 IMPLANT
GAUZE 4X4 16PLY ~~LOC~~+RFID DBL (SPONGE) ×2 IMPLANT
GAUZE SPONGE 4X4 12PLY STRL (GAUZE/BANDAGES/DRESSINGS) ×1 IMPLANT
GLOVE SURG ORTHO LTX SZ7.5 (GLOVE) ×4 IMPLANT
GOWN STRL REUS W/ TWL LRG LVL3 (GOWN DISPOSABLE) ×2 IMPLANT
GOWN STRL REUS W/TWL LRG LVL3 (GOWN DISPOSABLE) ×2
KIT TURNOVER KIT A (KITS) ×2 IMPLANT
MANIFOLD NEPTUNE II (INSTRUMENTS) ×2 IMPLANT
NEEDLE HYPO 22GX1.5 SAFETY (NEEDLE) ×2 IMPLANT
NS IRRIG 1000ML POUR BTL (IV SOLUTION) ×2 IMPLANT
PACK BASIN MINOR ARMC (MISCELLANEOUS) ×2 IMPLANT
PAD ABD DERMACEA PRESS 5X9 (GAUZE/BANDAGES/DRESSINGS) ×2 IMPLANT
SOL PREP PVP 2OZ (MISCELLANEOUS)
SOLUTION PREP PVP 2OZ (MISCELLANEOUS) ×1 IMPLANT
SPONGE T-LAP 18X18 ~~LOC~~+RFID (SPONGE) ×2 IMPLANT
STOCKINETTE 6  STRL (DRAPES) ×1
STOCKINETTE 6 STRL (DRAPES) IMPLANT
SUT ETHILON 3-0 FS-10 30 BLK (SUTURE)
SUTURE EHLN 3-0 FS-10 30 BLK (SUTURE) IMPLANT
SWAB CULTURE AMIES ANAERIB BLU (MISCELLANEOUS) ×2 IMPLANT
SYR 10ML LL (SYRINGE) ×2 IMPLANT
SYR BULB IRRIG 60ML STRL (SYRINGE) ×2 IMPLANT
WATER STERILE IRR 500ML POUR (IV SOLUTION) ×1 IMPLANT

## 2021-08-29 NOTE — Consult Note (Signed)
[facility] SURGICAL CONSULTATION NOTE (initial) - cpt: 99254   HISTORY OF PRESENT ILLNESS (HPI):  81 y.o. male presented to [facility] ED overnight for evaluation of left upper extremity wound. Patient reports around a 1 week history of left forearm swelling, tenderness, and erythema. No clear injury or trauma to the extremity prior to the onset of symptoms. No fever or chills reportedly at home. Patient has some degree of dementia and history is limited by this. He does appear high functioning. Work up in the ED revealed leukocytosis to 20.3K (now 20.9), lactic acidosis to 2.1 (now 4.0), renal function slightly elevated with sCr - 1.87 (now 1.91). He did undergo CT of his left extremity which was concerning for significant edema and inflammation as well as known blistering to the skin, no drainable collections seen. Initial Bcx came back today concerning for Group A strep (streptococcus pyogenes). ID is following and Abx were switched to Rocephin and Clindamycin.    Surgery is consulted by hospitalist physician Dr. Verlon Au, MD in this context for evaluation and management of left forearm cellulitis/blistering with Group A strep bacteremia.   PAST MEDICAL HISTORY (PMH):  Past Medical History:  Diagnosis Date   Arrhythmia    HTN (hypertension)    Hypercholesteremia    Obesity    Sleep apnea      PAST SURGICAL HISTORY ([place]):  Past Surgical History:  Procedure Laterality Date   BRAIN SURGERY     CATARACT EXTRACTION       MEDICATIONS:  Prior to Admission medications   Medication Sig Start Date End Date Taking? Authorizing Provider  ELIQUIS 2.5 MG TABS tablet Take 2.5 mg by mouth 2 (two) times daily. 08/28/21  Yes [provider]  febuxostat (ULORIC) 40 MG tablet Take 40 mg by mouth daily. 12/11/20  Yes [provider]  Melatonin 3 MG TABS Take 10 mg by mouth at bedtime. 08/20/16  Yes [provider]  metoprolol succinate (TOPROL-XL) 100 MG 24 hr tablet  Take 100 mg by mouth daily. 06/15/17  Yes [provider]  omeprazole (PRILOSEC) 20 MG capsule 40 mg daily. 02/08/16  Yes [provider]  pravastatin (PRAVACHOL) 40 MG tablet SMARTSIG:1 Tablet(s) By Mouth Every Evening 07/29/21  Yes [provider]  amLODipine (NORVASC) 2.5 MG tablet Take 2.5 mg by mouth daily. Patient not taking: Reported on 08/28/2021 06/07/17   [provider]  b complex vitamins tablet Take 1 tablet by mouth daily. Patient not taking: Reported on 08/28/2021    [provider]  Calcium Carbonate-Vitamin D (CALCIUM 500 + D PO) Take by mouth daily. Patient not taking: Reported on 08/28/2021    [provider]  Cholecalciferol (VITAMIN D3) 2000 UNITS TABS Take by mouth daily. Patient not taking: Reported on 08/28/2021    [provider]  finasteride (PROSCAR) 5 MG tablet Take 1 tablet (5 mg total) by mouth daily. Patient not taking: Reported on 08/28/2021 09/03/19   Hollice Espy, MD  fish oil-omega-3 fatty acids 1000 MG capsule Take 2 g by mouth daily. Patient not taking: Reported on 08/28/2021    [provider]  HYDROcodone-acetaminophen (NORCO) 5-325 MG tablet Take 1 tablet by mouth every 6 (six) hours as needed for moderate pain. Patient not taking: Reported on 08/28/2021 02/01/20   Paulette Blanch, MD  lisinopril (ZESTRIL) 5 MG tablet Take 5 mg by mouth daily. Patient not taking: Reported on 08/28/2021 07/28/19   [provider]  ondansetron (ZOFRAN ODT) 4 MG disintegrating tablet  Take 1 tablet (4 mg total) by mouth every 8 (eight) hours as needed for nausea or vomiting. Patient not taking: Reported on 08/28/2021 02/01/20   Paulette Blanch, MD  QUEtiapine (SEROQUEL) 50 MG tablet Take by mouth. Patient not taking: Reported on 08/28/2021 08/20/16   [provider]  Selenium 200 MCG CAPS Take by mouth daily. Patient not taking: Reported on 08/28/2021    [provider]  verapamil (CALAN-SR) 180 MG  CR tablet Take 180 mg by mouth at bedtime. Patient not taking: Reported on 08/28/2021    [provider]     ALLERGIES:  Allergies  Allergen Reactions   Lipitor [Atorvastatin Calcium]      SOCIAL HISTORY:  Social History   Socioeconomic History   Marital status: Unknown    Spouse name: Not on file   Number of children: Not on file   Years of education: Not on file   Highest education level: Not on file  Occupational History   Occupation: dairy farmer  Tobacco Use   Smoking status: Never   Smokeless tobacco: Never  Substance and Sexual Activity   Alcohol use: No   Drug use: No   Sexual activity: Not on file  Other Topics Concern   Not on file  Social History Narrative   Not on file   Social Determinants of Health   Financial Resource Strain: Not on file  Food Insecurity: Not on file  Transportation Needs: Not on file  Physical Activity: Not on file  Stress: Not on file  Social Connections: Not on file  Intimate Partner Violence: Not on file     FAMILY HISTORY:  Family History  Problem Relation Age of Onset   Alzheimer's disease Other    Hypertension Other    Prostate cancer Neg Hx    Bladder Cancer Neg Hx    Kidney cancer Neg Hx       REVIEW OF SYSTEMS:  Review of Systems  Constitutional:  Negative for chills and fever.  HENT:  Negative for congestion and sore throat.   Respiratory:  Negative for cough and shortness of breath.   Cardiovascular:  Negative for chest pain and palpitations.  Gastrointestinal:  Negative for abdominal pain, nausea and vomiting.  Genitourinary:  Negative for dysuria and urgency.  Skin:        + Left Forearm Cellulitis/Abscess  All other systems reviewed and are negative.  VITAL SIGNS:  Temp:  [98 F (36.7 C)] 98 F (36.7 C) (02/01 0256) Pulse Rate:  [56-106] 67 (02/01 0854) Resp:  [16-23] 16 (02/01 0849) BP: (117-152)/(44-97) 151/71 (02/01 0849) SpO2:  [92 %-100 %] 100 % (02/01 0849) Weight:  [79.4 kg] 79.4  kg (01/31 1920)     Height: 5\' 7"  (170.2 cm) Weight: 79.4 kg BMI (Calculated): 27.4   INTAKE/OUTPUT:  01/31 0701 - 02/01 0700 In: 2024.2 [I.V.:480; IV Piggyback:1544.2] Out: -   PHYSICAL EXAM:  Physical Exam Vitals and nursing note reviewed. Exam conducted with a chaperone present.  Constitutional:      General: He is not in acute distress.    Appearance: Normal appearance.  HENT:     Head: Normocephalic and atraumatic.  Eyes:     General: No scleral icterus.    Conjunctiva/sclera: Conjunctivae normal.  Cardiovascular:     Rate and Rhythm: Normal rate.     Pulses: Normal pulses.     Heart sounds: No murmur heard. Pulmonary:     Effort: Pulmonary effort is normal. No respiratory distress.  Skin:    General: Skin is warm and dry.     Findings: Erythema present.     Comments: He has significant erythema and blistering to the left forearm which appears worse than on admission pictures, this is tender. Overall, picture is highly concerning for necrotizing infection   Neurological:     General: No focal deficit present.     Mental Status: He is alert. Mental status is at baseline.  Psychiatric:        Mood and Affect: Mood normal.        Behavior: Behavior normal.     Labs:  CBC Latest Ref Rng & Units 08/29/2021 08/28/2021 01/31/2020  WBC 4.0 - 10.5 K/uL 20.9(H) 20.3(H) 8.8  Hemoglobin 13.0 - 17.0 g/dL 10.8(L) 12.3(L) 11.1(L)  Hematocrit 39.0 - 52.0 % 35.6(L) 41.8 37.2(L)  Platelets 150 - 400 K/uL 124(L) 153 189   CMP Latest Ref Rng & Units 08/29/2021 08/28/2021 02/01/2020  Glucose 70 - 99 mg/dL 121(H) 114(H) -  BUN 8 - 23 mg/dL 26(H) 21 -  Creatinine 0.61 - 1.24 mg/dL 1.91(H) 1.87(H) -  Sodium 135 - 145 mmol/L 134(L) 138 -  Potassium 3.5 - 5.1 mmol/L 3.8 3.9 -  Chloride 98 - 111 mmol/L 101 103 -  CO2 22 - 32 mmol/L 23 22 -  Calcium 8.9 - 10.3 mg/dL 8.6(L) 9.3 -  Total Protein 6.5 - 8.1 g/dL - 7.3 7.0  Total Bilirubin 0.3 - 1.2 mg/dL - 1.3(H) 0.7  Alkaline Phos 38 - 126 U/L -  78 56  AST 15 - 41 U/L - 36 21  ALT 0 - 44 U/L - 21 17    Imaging studies:   CT Left Forearm (08/29/2021 - 0200) personally reviewed which shows soft tissue inflammation/edema, blistering of skin, no obvious fluid collections, and radiologist report reviewed below:  IMPRESSION: Considerable subcutaneous edema is identified in the region the proximal and mid forearm as well as about the elbow similar to that seen on physical exam. Blistering is noted in the skin surface similar to that seen on physical exam.   No underlying bony abnormality is seen.   No drainable abscess is seen.   Assessment/Plan: (ICD-10's: L03.114) 81 y.o. male with leukocytosis, lactic acidosis, and AKI with severe erythema and blistering to the left forearm found to have positive Bcx with GAS overall picture concerning for severe necrotizing infection.   - We will plan to proceed emergently to the OR for incision and debridement with Dr Christian Mate as soon as available. Given his significant worsening blistering and physical examination findings, we anticipate this debridement may be fairly extensive to the point of potential need for amputation, will determine based on operative findings. Additionally, may need transfer to higher level of care depending on how extensive and resources available at our facility. Informed consent obtained   - Continue IV Abx (Ceftriaxone/Clindamycin); ID following   All of the above findings and recommendations were discussed with the patient and his family, and all of patient's and his family's questions were answered to their expressed satisfaction.  Thank you for the opportunity to participate in this patient's care.   -- Edison Simon, PA-C [facility] 08/29/2021, 10:29 AM [phone] M-F: 7am - 4pm

## 2021-08-29 NOTE — Anesthesia Preprocedure Evaluation (Signed)
Anesthesia Evaluation  Patient identified by MRN, date of birth, ID band Patient awake    Reviewed: Allergy & Precautions, NPO status , Patient's Chart, lab work & pertinent test results  Airway Mallampati: III  TM Distance: >3 FB Neck ROM: Full    Dental no notable dental hx. (+) Poor Dentition   Pulmonary sleep apnea and Continuous Positive Airway Pressure Ventilation ,    Pulmonary exam normal        Cardiovascular hypertension, Pt. on medications and Pt. on home beta blockers negative cardio ROS   Rhythm:Irregular + Systolic murmurs    Neuro/Psych CVA, Residual Symptoms negative psych ROS   GI/Hepatic Neg liver ROS, GERD  Medicated,  Endo/Other  negative endocrine ROS  Renal/GU Renal disease  negative genitourinary   Musculoskeletal  (+) Arthritis ,   Abdominal   Peds negative pediatric ROS (+)  Hematology negative hematology ROS (+)   Anesthesia Other Findings  Hypertension   Hyperlipidemia   Arthritis involving multiple sites   GERD (gastroesophageal reflux disease)   Benign prostatic hyperplasia without lower urinary tract symptoms   CKD (chronic kidney disease) stage 3, 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 The left ventricle is normal in size with mildly to moderately increased  wall thickness.  2. The left ventricular systolic function is normal, LVEF is visually  estimated at 60-65%.  3. There is grade II diastolic dysfunction (elevated filling pressure).  4. The mitral valve leaflets are mildly thickened with probably normal  leaflet mobility.  5. There is moderate mitral valve regurgitation.  6. There is moderate aortic regurgitation.  7. The left atrium is severely dilated in size.   8. The right ventricle is upper normal in size, with normal systolic  function.  9. The right atrium is mildly dilated in size.      Reproductive/Obstetrics negative OB ROS                             Anesthesia Physical Anesthesia Plan  ASA: 3 and emergent  Anesthesia Plan: General   Post-op Pain Management:    Induction: Intravenous, Rapid sequence and Cricoid pressure planned  PONV Risk Score and Plan: 2 and Propofol infusion  Airway Management Planned: Oral ETT  Additional Equipment:   Intra-op Plan:   Post-operative Plan: Extubation in OR  Informed Consent: I have reviewed the patients History and Physical, chart, labs and discussed the procedure including the risks, benefits and alternatives for the proposed anesthesia with the patient or authorized representative who has indicated his/her understanding and acceptance.       Plan Discussed with: CRNA, Surgeon and Anesthesiologist  Anesthesia Plan Comments:         Anesthesia Quick Evaluation

## 2021-08-29 NOTE — Progress Notes (Signed)
Lab notified of pending collections.  °

## 2021-08-29 NOTE — ED Notes (Signed)
Pt noted pulling on monitoring cords , tugging out his gown. Pt's son at bedside reports it is at this time he gets up. Confusion of patient at baseline per pt's son after pt had stroke last August. Pt however at this time follows and responds to reorientation and redirection .

## 2021-08-29 NOTE — ED Notes (Addendum)
Called pharmacy again for this pt's D5 sodium phosphate fluid . Requested 2x and not received

## 2021-08-29 NOTE — Progress Notes (Signed)
PHARMACY - PHYSICIAN COMMUNICATION CRITICAL VALUE ALERT - BLOOD CULTURE IDENTIFICATION (BCID)  LEVERN PITTER is an 81 y.o. male who presented to [facility] on 08/28/2021 with a chief complaint of left arm pain and swelling  Assessment:  81 yom with arm cellulitis. Blood culture with GPC 1/2 sets,  BCID detected S. pyogenes  Name of physician (or Provider) Contacted: Drs Mahala Menghini and Rivka Safer  Current antibiotics: vancomycin and ceftriaxone  Changes to prescribed antibiotics recommended:  ID has seen patient and changed vancomycin to clindamycin  Results for orders placed or performed during the hospital encounter of 08/28/21  Blood Culture ID Panel (Reflexed) (Collected: 08/28/2021  7:24 PM)  Result Value Ref Range   Enterococcus faecalis NOT DETECTED NOT DETECTED   Enterococcus Faecium NOT DETECTED NOT DETECTED   Listeria monocytogenes NOT DETECTED NOT DETECTED   Staphylococcus species NOT DETECTED NOT DETECTED   Staphylococcus aureus (BCID) NOT DETECTED NOT DETECTED   Staphylococcus epidermidis NOT DETECTED NOT DETECTED   Staphylococcus lugdunensis NOT DETECTED NOT DETECTED   Streptococcus species DETECTED (A) NOT DETECTED   Streptococcus agalactiae NOT DETECTED NOT DETECTED   Streptococcus pneumoniae NOT DETECTED NOT DETECTED   Streptococcus pyogenes DETECTED (A) NOT DETECTED   A.calcoaceticus-baumannii NOT DETECTED NOT DETECTED   Bacteroides fragilis NOT DETECTED NOT DETECTED   Enterobacterales NOT DETECTED NOT DETECTED   Enterobacter cloacae complex NOT DETECTED NOT DETECTED   Escherichia coli NOT DETECTED NOT DETECTED   Klebsiella aerogenes NOT DETECTED NOT DETECTED   Klebsiella oxytoca NOT DETECTED NOT DETECTED   Klebsiella pneumoniae NOT DETECTED NOT DETECTED   Proteus species NOT DETECTED NOT DETECTED   Salmonella species NOT DETECTED NOT DETECTED   Serratia marcescens NOT DETECTED NOT DETECTED   Haemophilus influenzae NOT DETECTED NOT DETECTED   Neisseria  meningitidis NOT DETECTED NOT DETECTED   Pseudomonas aeruginosa NOT DETECTED NOT DETECTED   Stenotrophomonas maltophilia NOT DETECTED NOT DETECTED   Candida albicans NOT DETECTED NOT DETECTED   Candida auris NOT DETECTED NOT DETECTED   Candida glabrata NOT DETECTED NOT DETECTED   Candida krusei NOT DETECTED NOT DETECTED   Candida parapsilosis NOT DETECTED NOT DETECTED   Candida tropicalis NOT DETECTED NOT DETECTED   Cryptococcus neoformans/gattii NOT DETECTED NOT DETECTED    Juliette Alcide, PharmD, BCPS, BCIDP Work Cell: [phone] 08/29/2021 9:51 AM

## 2021-08-29 NOTE — Anesthesia Procedure Notes (Signed)
Procedure Name: Intubation Date/Time: 08/29/2021 12:25 PM Performed by: Loletha Grayer, CRNA Pre-anesthesia Checklist: Patient identified, Patient being monitored, Timeout performed, Emergency Drugs available and Suction available Patient Re-evaluated:Patient Re-evaluated prior to induction Oxygen Delivery Method: Circle system utilized Preoxygenation: Pre-oxygenation with 100% oxygen Induction Type: IV induction Ventilation: Mask ventilation without difficulty Laryngoscope Size: 3 and Glidescope Grade View: Grade I Tube type: Oral Tube size: 7.5 mm Number of attempts: 1 Airway Equipment and Method: Stylet Placement Confirmation: ETT inserted through vocal cords under direct vision, positive ETCO2 and breath sounds checked- equal and bilateral Secured at: 21 cm Tube secured with: Tape Dental Injury: Teeth and Oropharynx as per pre-operative assessment

## 2021-08-29 NOTE — Progress Notes (Signed)
Patients daughter at bedside concerned and showed this RN a picture of her sons foot who now has a similar spot on his left inner ankle as this patient has on his left upper arm. States [place]. Attending MD and ID MD made aware.

## 2021-08-29 NOTE — Consult Note (Addendum)
NAME: Jose Baker  DOB: [birth year]  MRN: [id number]  Date/Time: 08/29/2021 10:14 AM  REQUESTING PROVIDER: Dr. Verlon Au Subjective:  REASON FOR CONSULT: Group A strep pyogenes bacteremia No history available from patient- chart reviewed- spoke to his son and daughter  Jose Baker is a 81 y.o. male with a history of neurocognitive impairment, Memory loss, Afib on Eliuis, OSA, GERD, OA presented to the ED on 08/28/21 with left arm pain and swelling- Pt does not know how long he has had it and how he got it Son at bedside  thinks it happened in 2 days,  Vitals in the ED Temp of 98, BP 120/78, sats 94% and HR 81  Labs showed WBC 20.3, HB 12.3, PLT 153, cr 1.87 Blood culture sent and he wa satrted on vanco and ceftriaxone- Culture came back as Group A strep and I am seeing the patient for the same  As per his daughter some other family members are sick as well- wife has sore throat Grandson has skin cellulitis   Past Medical History:  Diagnosis Date   Arrhythmia    HTN (hypertension)    Hypercholesteremia    Obesity    Sleep apnea     Past Surgical History:  Procedure Laterality Date   BRAIN SURGERY     CATARACT EXTRACTION      Social History   Socioeconomic History   Marital status: Unknown    Spouse name: Not on file   Number of children: Not on file   Years of education: Not on file   Highest education level: Not on file  Occupational History   Occupation: dairy farmer  Tobacco Use   Smoking status: Never   Smokeless tobacco: Never  Substance and Sexual Activity   Alcohol use: No   Drug use: No   Sexual activity: Not on file  Other Topics Concern   Not on file  Social History Narrative   Not on file   Social Determinants of Health   Financial Resource Strain: Not on file  Food Insecurity: Not on file  Transportation Needs: Not on file  Physical Activity: Not on file  Stress: Not on file  Social Connections: Not on file  Intimate Partner Violence: Not on file     Family History  Problem Relation Age of Onset   Alzheimer's disease Other    Hypertension Other    Prostate cancer Neg Hx    Bladder Cancer Neg Hx    Kidney cancer Neg Hx    Allergies  Allergen Reactions   Lipitor [Atorvastatin Calcium]    I? Current Facility-Administered Medications  Medication Dose Route Frequency Provider Last Rate Last Admin   acetaminophen (TYLENOL) tablet 650 mg  650 mg Oral Q6H PRN Cox, Amy N, DO       Or   acetaminophen (TYLENOL) suppository 650 mg  650 mg Rectal Q6H PRN Cox, Amy N, DO       apixaban (ELIQUIS) tablet 2.5 mg  2.5 mg Oral BID Cox, Amy N, DO   2.5 mg at 08/29/21 0854   [START ON 08/30/2021] cefTRIAXone (ROCEPHIN) 2 g in sodium chloride 0.9 % 100 mL IVPB  2 g Intravenous Q24H Zeigler, Dustin G, RPH       clindamycin (CLEOCIN) IVPB 900 mg  900 mg Intravenous Q8H Samtani, Jai-Gurmukh, MD       hydrALAZINE (APRESOLINE) tablet 10 mg  10 mg Oral Q6H PRN Cox, Amy N, DO       HYDROcodone-acetaminophen (NORCO/VICODIN)  5-325 MG per tablet 1 tablet  1 tablet Oral Q6H PRN Cox, Amy N, DO       lactated ringers infusion   Intravenous Continuous Nita Sells, MD 150 mL/hr at 08/29/21 0915 New Bag at 08/29/21 0915   melatonin tablet 10 mg  10 mg Oral QHS Cox, Amy N, DO       metoprolol succinate (TOPROL-XL) 24 hr tablet 100 mg  100 mg Oral Daily Cox, Amy N, DO   100 mg at 08/29/21 0854   ondansetron (ZOFRAN) tablet 4 mg  4 mg Oral Q6H PRN Cox, Amy N, DO       Or   ondansetron (ZOFRAN) injection 4 mg  4 mg Intravenous Q6H PRN Cox, Amy N, DO       pantoprazole (PROTONIX) EC tablet 80 mg  80 mg Oral Daily Cox, Amy N, DO   80 mg at 08/29/21 0853   pravastatin (PRAVACHOL) tablet 40 mg  40 mg Oral q1800 Cox, Amy N, DO       sodium phosphate 15 mmol in dextrose 5 % 250 mL infusion  15 mmol Intravenous Once Cox, Amy N, DO   Paused at 08/29/21 0915   Current Outpatient Medications  Medication Sig Dispense Refill   ELIQUIS 2.5 MG TABS tablet Take 2.5 mg by  mouth 2 (two) times daily.     febuxostat (ULORIC) 40 MG tablet Take 40 mg by mouth daily.     Melatonin 3 MG TABS Take 10 mg by mouth at bedtime.     metoprolol succinate (TOPROL-XL) 100 MG 24 hr tablet Take 100 mg by mouth daily.  1   omeprazole (PRILOSEC) 20 MG capsule 40 mg daily.     pravastatin (PRAVACHOL) 40 MG tablet SMARTSIG:1 Tablet(s) By Mouth Every Evening     amLODipine (NORVASC) 2.5 MG tablet Take 2.5 mg by mouth daily. (Patient not taking: Reported on 08/28/2021)  6   b complex vitamins tablet Take 1 tablet by mouth daily. (Patient not taking: Reported on 08/28/2021)     Calcium Carbonate-Vitamin D (CALCIUM 500 + D PO) Take by mouth daily. (Patient not taking: Reported on 08/28/2021)     Cholecalciferol (VITAMIN D3) 2000 UNITS TABS Take by mouth daily. (Patient not taking: Reported on 08/28/2021)     finasteride (PROSCAR) 5 MG tablet Take 1 tablet (5 mg total) by mouth daily. (Patient not taking: Reported on 08/28/2021) 30 tablet 11   fish oil-omega-3 fatty acids 1000 MG capsule Take 2 g by mouth daily. (Patient not taking: Reported on 08/28/2021)     HYDROcodone-acetaminophen (NORCO) 5-325 MG tablet Take 1 tablet by mouth every 6 (six) hours as needed for moderate pain. (Patient not taking: Reported on 08/28/2021) 15 tablet 0   lisinopril (ZESTRIL) 5 MG tablet Take 5 mg by mouth daily. (Patient not taking: Reported on 08/28/2021)     ondansetron (ZOFRAN ODT) 4 MG disintegrating tablet Take 1 tablet (4 mg total) by mouth every 8 (eight) hours as needed for nausea or vomiting. (Patient not taking: Reported on 08/28/2021) 15 tablet 0   QUEtiapine (SEROQUEL) 50 MG tablet Take by mouth. (Patient not taking: Reported on 08/28/2021)     Selenium 200 MCG CAPS Take by mouth daily. (Patient not taking: Reported on 08/28/2021)     verapamil (CALAN-SR) 180 MG CR tablet Take 180 mg by mouth at bedtime. (Patient not taking: Reported on 08/28/2021)       Abtx:  Anti-infectives (From admission, onward)     Start  Dose/Rate Route Frequency Ordered Stop   08/31/21 0300  vancomycin (VANCOREADY) IVPB 1500 mg/300 mL  Status:  Discontinued        1,500 mg 150 mL/hr over 120 Minutes Intravenous Every 48 hours 08/29/21 0113 08/29/21 0941   08/30/21 1000  cefTRIAXone (ROCEPHIN) 2 g in sodium chloride 0.9 % 100 mL IVPB        2 g 200 mL/hr over 30 Minutes Intravenous Every 24 hours 08/29/21 0940     08/29/21 1030  clindamycin (CLEOCIN) IVPB 900 mg        900 mg 100 mL/hr over 30 Minutes Intravenous Every 8 hours 08/29/21 0941     08/29/21 1000  cefTRIAXone (ROCEPHIN) 2 g in sodium chloride 0.9 % 100 mL IVPB  Status:  Discontinued        2 g 200 mL/hr over 30 Minutes Intravenous Every 24 hours 08/28/21 2237 08/29/21 0940   08/29/21 0115  vancomycin (VANCOREADY) IVPB 1750 mg/350 mL        1,750 mg 175 mL/hr over 120 Minutes Intravenous  Once 08/29/21 0100 08/29/21 0455   08/28/21 2115  cefTRIAXone (ROCEPHIN) 1 g in sodium chloride 0.9 % 100 mL IVPB        1 g 200 mL/hr over 30 Minutes Intravenous  Once 08/28/21 2113 08/28/21 2231   08/28/21 2000  cefTRIAXone (ROCEPHIN) 1 g in sodium chloride 0.9 % 100 mL IVPB        1 g 200 mL/hr over 30 Minutes Intravenous  Once 08/28/21 1959 08/28/21 2056       REVIEW OF SYSTEMS: hard of hearing pleasantly confused, he is not a reliable historian  Const: negative fever, negative chills, negative weight loss Eyes: negative diplopia or visual changes, negative eye pain ENT: negative coryza, negative sore throat Resp: has  cough, no, dyspnea Cards: negative for chest pain, palpitations, lower extremity edema GU: negative for frequency, dysuria and hematuria GI: Negative for abdominal pain, diarrhea, bleeding, constipation Skin:left arm pain and swelling Heme: negative for easy bruising and gum/nose bleeding MS: negative for myalgias, arthralgias, back pain and muscle weakness Neurolo:memory loss , confusion  Psych: negative for feelings of anxiety,  depression  Endocrine: negative for thyroid, diabetes Allergy/Immunology- Lipitor  Objective:  VITALS:  BP (!) 151/71    Pulse 67    Temp 98 F (36.7 C) (Oral)    Resp 16    Ht 5\' 7"  (1.702 m)    Wt 79.4 kg    SpO2 100%    BMI 27.41 kg/m  Patient Vitals for the past 24 hrs:  BP Temp Temp src Pulse Resp SpO2 Height Weight  08/29/21 1359 (!) 170/70 97.9 F (36.6 C) -- 65 15 100 % -- --  08/29/21 1345 -- -- -- 65 16 94 % -- --  08/29/21 1330 (!) 176/60 -- -- 72 15 99 % -- --  08/29/21 1315 (!) 150/66 (!) 97.4 F (36.3 C) -- 63 15 99 % -- --  08/29/21 1052 (!) 171/83 -- -- 66 18 97 % -- --  08/29/21 0854 -- -- -- 67 -- -- -- --  08/29/21 0849 (!) 151/71 -- -- 80 16 100 % -- --  08/29/21 0519 (!) 129/47 -- -- (!) 56 17 96 % -- --  08/29/21 0400 138/61 -- -- 63 18 92 % -- --  08/29/21 0256 (!) 142/68 98 F (36.7 C) Oral 70 20 99 % -- --  08/29/21 0200 (!) 122/52 -- -- 79 19 94 % -- --  08/29/21 0100 (!) 117/44 -- -- 71 (!) 22 92 % -- --  08/28/21 2300 140/90 -- -- 86 19 93 % -- --  08/28/21 2215 -- -- -- 75 20 96 % -- --  08/28/21 2200 (!) 152/96 -- -- 66 -- 100 % -- --  08/28/21 2130 (!) 132/95 -- -- (!) 106 (!) 23 99 % -- --  08/28/21 2030 120/76 -- -- 71 (!) 23 95 % -- --  08/28/21 2000 120/78 -- -- 81 20 94 % -- --  08/28/21 1957 -- -- -- 76 20 99 % -- --  08/28/21 1954 124/83 -- -- -- 19 -- -- --  08/28/21 1920 -- -- -- -- -- -- 5\' 7"  (1.702 m) 79.4 kg  08/28/21 1919 (!) 143/97 98 F (36.7 C) Oral 79 16 99 % -- --    PHYSICAL EXAM:  General: Alert, cooperative, no distress, appears stated age. Oriented in person. Responds to questions  Head: Normocephalic, without obvious abnormality, atraumatic. Eyes: Conjunctivae clear, anicteric sclerae. Pupils are equal ENT Nares normal. No drainage or sinus tenderness. Lips, mucosa, and tongue normal. No Thrush Neck: Supple, symmetrical, no adenopathy, thyroid: non tender no carotid bruit and no JVD. Back: No CVA tenderness. Lungs:  Clear to auscultation bilaterally. No Wheezing or Rhonchi. No rales. Heart: irregular well controlled HR Abdomen: Soft, non-tender,not distended. Bowel sounds normal. No masses Extremities: left forearm- hemorrhagic blister, induration around- erythema     Post OP   Skin: as above Lymph: Cervical, supraclavicular normal. Neurologic: Grossly non-focal Pertinent Labs Lab Results CBC    Component Value Date/Time   WBC 20.9 (H) 08/29/2021 0657   RBC 4.17 (L) 08/29/2021 0657   HGB 10.8 (L) 08/29/2021 0657   HCT 35.6 (L) 08/29/2021 0657   PLT 124 (L) 08/29/2021 0657   MCV 85.4 08/29/2021 0657   MCH 25.9 (L) 08/29/2021 0657   MCHC 30.3 08/29/2021 0657   RDW 16.7 (H) 08/29/2021 0657   LYMPHSABS 0.4 (L) 08/28/2021 1924   MONOABS 1.1 (H) 08/28/2021 1924   EOSABS 0.0 08/28/2021 1924   BASOSABS 0.0 08/28/2021 1924    CMP Latest Ref Rng & Units 08/29/2021 08/28/2021 02/01/2020  Glucose 70 - 99 mg/dL 121(H) 114(H) -  BUN 8 - 23 mg/dL 26(H) 21 -  Creatinine 0.61 - 1.24 mg/dL 1.91(H) 1.87(H) -  Sodium 135 - 145 mmol/L 134(L) 138 -  Potassium 3.5 - 5.1 mmol/L 3.8 3.9 -  Chloride 98 - 111 mmol/L 101 103 -  CO2 22 - 32 mmol/L 23 22 -  Calcium 8.9 - 10.3 mg/dL 8.6(L) 9.3 -  Total Protein 6.5 - 8.1 g/dL - 7.3 7.0  Total Bilirubin 0.3 - 1.2 mg/dL - 1.3(H) 0.7  Alkaline Phos 38 - 126 U/L - 78 56  AST 15 - 41 U/L - 36 21  ALT 0 - 44 U/L - 21 17      Microbiology: Recent Results (from the past 240 hour(s))  Resp Panel by RT-PCR (Flu A&B, Covid) Nasopharyngeal Swab     Status: None   Collection Time: 08/28/21  7:23 PM   Specimen: Nasopharyngeal Swab; Nasopharyngeal(NP) swabs in vial transport medium  Result Value Ref Range Status   SARS Coronavirus 2 by RT PCR NEGATIVE NEGATIVE Final    Comment: (NOTE) SARS-CoV-2 target nucleic acids are NOT DETECTED.  The SARS-CoV-2 RNA is generally detectable in upper respiratory specimens during the acute phase of infection. The  lowest concentration of SARS-CoV-2 viral copies this assay can detect  is 138 copies/mL. A negative result does not preclude SARS-Cov-2 infection and should not be used as the sole basis for treatment or other patient management decisions. A negative result may occur with  improper specimen collection/handling, submission of specimen other than nasopharyngeal swab, presence of viral mutation(s) within the areas targeted by this assay, and inadequate number of viral copies(<138 copies/mL). A negative result must be combined with clinical observations, patient history, and epidemiological information. The expected result is Negative.  Fact Sheet for Patients:  EntrepreneurPulse.com.au  Fact Sheet for Healthcare Providers:  IncredibleEmployment.be  This test is no t yet approved or cleared by the [place] FDA and  has been authorized for detection and/or diagnosis of SARS-CoV-2 by FDA under an Emergency Use Authorization (EUA). This EUA will remain  in effect (meaning this test can be used) for the duration of the COVID-19 declaration under Section 564(b)(1) of the Act, 21 U.S.C.section 360bbb-3(b)(1), unless the authorization is terminated  or revoked sooner.       Influenza A by PCR NEGATIVE NEGATIVE Final   Influenza B by PCR NEGATIVE NEGATIVE Final    Comment: (NOTE) The Xpert Xpress SARS-CoV-2/FLU/RSV plus assay is intended as an aid in the diagnosis of influenza from Nasopharyngeal swab specimens and should not be used as a sole basis for treatment. Nasal washings and aspirates are unacceptable for Xpert Xpress SARS-CoV-2/FLU/RSV testing.  Fact Sheet for Patients: EntrepreneurPulse.com.au  Fact Sheet for Healthcare Providers: IncredibleEmployment.be  This test is not yet approved or cleared by the [place] FDA and has been authorized for detection and/or diagnosis of SARS-CoV-2 by FDA under  an Emergency Use Authorization (EUA). This EUA will remain in effect (meaning this test can be used) for the duration of the COVID-19 declaration under Section 564(b)(1) of the Act, 21 U.S.C. section 360bbb-3(b)(1), unless the authorization is terminated or revoked.  Performed at [facility], [address] [place], [place] [address]   Blood culture (routine x 2)     Status: None (Preliminary result)   Collection Time: 08/28/21  7:24 PM   Specimen: BLOOD  Result Value Ref Range Status   Specimen Description BLOOD RIGHT ANTECUBITAL  Final   Special Requests BOTTLES DRAWN AEROBIC AND ANAEROBIC BCHV  Final   Culture  Setup Time   Final    GRAM POSITIVE COCCI IN BOTH AEROBIC AND ANAEROBIC BOTTLES Organism ID to follow CRITICAL RESULT CALLED TO, READ BACK BY AND VERIFIED WITHLu Duffel PHARMD 0827 08/29/21 HNM Performed at [facility], [address] [place], [place] [address]    Culture GRAM POSITIVE COCCI  Final   Report Status PENDING  Incomplete  Blood Culture ID Panel (Reflexed)     Status: Abnormal   Collection Time: 08/28/21  7:24 PM  Result Value Ref Range Status   Enterococcus faecalis NOT DETECTED NOT DETECTED Final   Enterococcus Faecium NOT DETECTED NOT DETECTED Final   Listeria monocytogenes NOT DETECTED NOT DETECTED Final   Staphylococcus species NOT DETECTED NOT DETECTED Final   Staphylococcus aureus (BCID) NOT DETECTED NOT DETECTED Final   Staphylococcus epidermidis NOT DETECTED NOT DETECTED Final   Staphylococcus lugdunensis NOT DETECTED NOT DETECTED Final   Streptococcus species DETECTED (A) NOT DETECTED Final    Comment: CRITICAL RESULT CALLED TO, READ BACK BY AND VERIFIED WITH: DEVAN MITCHELL PHARMD 0827 08/29/21 HNM    Streptococcus agalactiae NOT DETECTED NOT DETECTED Final   Streptococcus pneumoniae NOT DETECTED NOT DETECTED Final   Streptococcus pyogenes DETECTED (A) NOT  DETECTED Final    Comment: CRITICAL RESULT CALLED TO, READ  BACK BY AND VERIFIED WITH: Lu Duffel PHARMD 0827 08/29/21 HNM    A.calcoaceticus-baumannii NOT DETECTED NOT DETECTED Final   Bacteroides fragilis NOT DETECTED NOT DETECTED Final   Enterobacterales NOT DETECTED NOT DETECTED Final   Enterobacter cloacae complex NOT DETECTED NOT DETECTED Final   Escherichia coli NOT DETECTED NOT DETECTED Final   Klebsiella aerogenes NOT DETECTED NOT DETECTED Final   Klebsiella oxytoca NOT DETECTED NOT DETECTED Final   Klebsiella pneumoniae NOT DETECTED NOT DETECTED Final   Proteus species NOT DETECTED NOT DETECTED Final   Salmonella species NOT DETECTED NOT DETECTED Final   Serratia marcescens NOT DETECTED NOT DETECTED Final   Haemophilus influenzae NOT DETECTED NOT DETECTED Final   Neisseria meningitidis NOT DETECTED NOT DETECTED Final   Pseudomonas aeruginosa NOT DETECTED NOT DETECTED Final   Stenotrophomonas maltophilia NOT DETECTED NOT DETECTED Final   Candida albicans NOT DETECTED NOT DETECTED Final   Candida auris NOT DETECTED NOT DETECTED Final   Candida glabrata NOT DETECTED NOT DETECTED Final   Candida krusei NOT DETECTED NOT DETECTED Final   Candida parapsilosis NOT DETECTED NOT DETECTED Final   Candida tropicalis NOT DETECTED NOT DETECTED Final   Cryptococcus neoformans/gattii NOT DETECTED NOT DETECTED Final    Comment: Performed at [facility], [address] [place], [place] [address]  Blood culture (routine x 2)     Status: None (Preliminary result)   Collection Time: 08/28/21 10:08 PM   Specimen: BLOOD  Result Value Ref Range Status   Specimen Description BLOOD RIGHT HAND  Final   Special Requests   Final    BOTTLES DRAWN AEROBIC ONLY Blood Culture results may not be optimal due to an inadequate volume of blood received in culture bottles   Culture   Final    NO GROWTH < 12 HOURS Performed at [facility], [address], [place], [place] [address]    Report Status PENDING  Incomplete    IMAGING  RESULTS: CT arm Considerable subcutaneous edema is identified in the region the proximal and mid forearm as well as about the elbow similar to that seen on physical exam. Blistering is noted in the skin surface similar to that seen on physical exam. 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 ___________________________________________________ Discussed with patient, requesting provider Note:  This document was prepared using Dragon voice recognition software and may include unintentional dictation errors.

## 2021-08-29 NOTE — Op Note (Addendum)
Excisional debridement of dermis left forearm.  Pre-operative Diagnosis: Strep pyogenes cellulitis left forearm with bacteremia.   Post-operative Diagnosis: same.   Surgeon: Campbell Lerner, M.D., [facility]  Anesthesia: General endotracheal.   Findings: Extensive pyogenic exudative accumulation between the epidermis and dermis.  No evidence of frank necrosis or extension to subcutaneous infection.  Remainder of forearm is soft and supple without areas of induration, or fluctuance.  This correlates with the CT findings from last night.  Pre-op:   Post-op:    Surface area size: Roughly a trapezoid, 20 cm in greatest length with 10 cm in width.  The depth was only to the deeper layers of dermis.  Estimated Blood Loss: 10 mL         Specimens: Blebs, fluid and pyogenic debris, excised and excisionally debrided with curette and dry gauze.  Good punctate bleeding throughout surface.          Complications: none              Procedure Details  The patient was seen again in the Holding Room. The benefits, complications, treatment options, and expected outcomes were discussed with the patient. The risks of bleeding, infection, recurrence of symptoms, failure to resolve symptoms, unanticipated injury, prosthetic placement, prosthetic infection, any of which could require further surgery were reviewed with the patient. The likelihood of improving the patient's symptoms with return to their baseline status is hopeful.  The patient and/or family concurred with the proposed plan, giving informed consent.  The patient was taken to Operating Room, identified and the procedure verified.    Prior to the induction of general anesthesia, antibiotic prophylaxis was administered. VTE prophylaxis was in place.  General anesthesia was then administered and tolerated well. After the induction, the patient was positioned in the supine position and the left arm was prepped with Chloraprep and draped in the sterile  fashion.  A Time Out was held and the above information confirmed. Sharply debrided the extensive bleb to the surface of the left volar forearm with Metzenbaum scissors, utilized dry gauze and lap sponges to cleanse the exudative debris from the underlying deeper layers of dermis.  Utilized bone curette to sharply debride any of the questionable areas of dense adherence versus viability. All the forearm skin appeared to be viable, supple without any underlying tension, swelling or hint of compartment syndrome.  There appeared to be no involvement of the subcutaneous tissues, no appreciable abscess, no crepitance identified. Then proceeded with dressing the wound with Silvadene, dry gauze ABD pad Kerlix roll and Ace wrap.      Campbell Lerner M.D., [facility] 08/29/2021 1:14 PM

## 2021-08-29 NOTE — Anesthesia Postprocedure Evaluation (Signed)
Anesthesia Post Note  Patient: Jose Baker  Procedure(s) Performed: INCISION AND DRAINAGE ABSCESS (Left)  Patient location during evaluation: PACU Anesthesia Type: General Level of consciousness: awake and alert, awake and oriented Pain management: pain level controlled Vital Signs Assessment: post-procedure vital signs reviewed and stable Respiratory status: spontaneous breathing, nonlabored ventilation and respiratory function stable Cardiovascular status: blood pressure returned to baseline and stable Postop Assessment: no apparent nausea or vomiting Anesthetic complications: no   No notable events documented.   Last Vitals:  Vitals:   08/29/21 1359 08/29/21 1420  BP: (!) 170/70 (!) 179/71  Pulse: 65 70  Resp: 15   Temp: 36.6 C 36.6 C  SpO2: 100% 93%    Last Pain:  Vitals:   08/29/21 1420  TempSrc: Oral  PainSc:                  Manfred Arch

## 2021-08-29 NOTE — Assessment & Plan Note (Signed)
-   IV phosphorus ordered

## 2021-08-29 NOTE — Transfer of Care (Signed)
Immediate Anesthesia Transfer of Care Note  Patient: Jose Baker  Procedure(s) Performed: INCISION AND DRAINAGE ABSCESS (Left)  Patient Location: PACU  Anesthesia Type:General  Level of Consciousness: drowsy  Airway & Oxygen Therapy: Patient Spontanous Breathing and Patient connected to face mask oxygen  Post-op Assessment: Report given to RN  Post vital signs: stable  Last Vitals:  Vitals Value Taken Time  BP 150/66 08/29/21 1313  Temp    Pulse 63 08/29/21 1314  Resp 15 08/29/21 1314  SpO2 99 % 08/29/21 1314  Vitals shown include unvalidated device data.  Last Pain:  Vitals:   08/29/21 0400  TempSrc:   PainSc: Asleep         Complications: No notable events documented.

## 2021-08-29 NOTE — Progress Notes (Signed)
Pharmacy Antibiotic Note  Jose Baker is a 81 y.o. male admitted on 08/28/2021 with cellulitis.  Pharmacy has been consulted for Vancomycin dosing.  Plan: Vancomycin 1750 mg IV X 1 ordered for 02/01 @ 0200. Vancomycin 1500 mg IV Q48H ordered to start on 02/03 @ 0200.  AUC = 461.3 Vanc trough = 9.4   Height: 5\' 7"  (170.2 cm) Weight: 79.4 kg (175 lb) IBW/kg (Calculated) : 66.1  Temp (24hrs), Avg:98 F (36.7 C), Min:98 F (36.7 C), Max:98 F (36.7 C)  Recent Labs  Lab 08/28/21 1924 08/28/21 2208  WBC 20.3*  --   CREATININE 1.87*  --   LATICACIDVEN 2.1* 4.0*    Estimated Creatinine Clearance: 31.3 mL/min (A) (by C-G formula based on SCr of 1.87 mg/dL (H)).    Allergies  Allergen Reactions   Lipitor [Atorvastatin Calcium]     Antimicrobials this admission:   >>    >>   Dose adjustments this admission:   Microbiology results:  BCx:   UCx:    Sputum:    MRSA PCR:   Thank you for allowing pharmacy to be a part of this patients care.  Jose Baker D 08/29/2021 1:14 AM

## 2021-08-29 NOTE — Progress Notes (Signed)
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 Dolores Frame 08/29/2021 Continuing adjusted antibiotics per ID penicillin G 3 million every 4, clindamycin 900 every 8 Pain control Norco 1 every 6 as needed At this time continue cardiac monitoring Continue saline 150 cc/H Lactic acid is trending down from 4-2.3 and should improve with source control Follow CBC and fever curve in a.m. Paroxysmal A. fib on Eliquis CHADS2 score >4 Continue metoprolol XL 100, Eliquis 2.5 twice daily given age AKI on admission Baseline creatinine about 1.4 Continue IV fluid Bladder scan as may be obstructive component to his AKI Repeat labs in a.m.-holding lisinopril 5 Phosphorus replaced aggressively earlier in admission OSA on CPAP-noncompliant Will need titration and reassessment in the outpatient setting HTN At this time hold amlodipine 2.5 lisinopril as above Mild dementia   DVT prophylaxis: Eliquis Code Status: Full Family Communication: Discussed with patient's daughter (former Engineer, civil (consulting)) Disposition:  Status is: Inpatient Remains inpatient appropriate because: Postop state     Consultants:  Infectious  disease General surgery  Procedures: I&D 2/1  Antimicrobials: Clindamycin penicillin   Subjective: Seen prior to surgery in ED-doing fair-no pain-knows where he is oriented to place person States is hungry   Objective: Vitals:   08/29/21 1330 08/29/21 1345 08/29/21 1359 08/29/21 1420  BP: (!) 176/60  (!) 170/70 (!) 179/71  Pulse: 72 65 65 70  Resp: 15 16 15    Temp:   97.9 F (36.6 C) 97.8 F (36.6 C)  TempSrc:    Oral  SpO2: 99% 94% 100% 93%  Weight:      Height:        Intake/Output Summary (Last 24 hours) at 08/29/2021 1751 Last data filed at 08/29/2021 1254 Gross per 24 hour  Intake 2124.17 ml  Output --  Net 2124.17 ml   Filed Weights   08/28/21 1920  Weight: 79.4 kg    Examination:  Awake coherent in nad thick neck mallampatti 2 Cta b no rales rhonchi no wheeze Abd sodft nt dn no rebound Area on L arm larger No Le edema ROM intact power 5/5  Data Reviewed: personally reviewed   CBC    Component Value Date/Time   WBC 20.9 (H) 08/29/2021 0657   RBC 4.17 (L) 08/29/2021 0657   HGB 10.8 (L) 08/29/2021 0657   HCT 35.6 (L) 08/29/2021 0657   PLT 124 (L) 08/29/2021 0657   MCV 85.4 08/29/2021 0657   MCH 25.9 (L) 08/29/2021 0657   MCHC 30.3 08/29/2021 0657   RDW 16.7 (H) 08/29/2021 0657   LYMPHSABS 0.4 (L) 08/28/2021 1924   MONOABS 1.1 (H) 08/28/2021 1924   EOSABS 0.0 08/28/2021 1924   BASOSABS 0.0 08/28/2021 1924   CMP Latest Ref Rng & Units 08/29/2021 08/28/2021 02/01/2020  Glucose 70 -  99 mg/dL 322(G) 254(Y) -  BUN 8 - 23 mg/dL 70(W) 21 -  Creatinine 0.61 - 1.24 mg/dL 2.37(S) 2.83(T) -  Sodium 135 - 145 mmol/L 134(L) 138 -  Potassium 3.5 - 5.1 mmol/L 3.8 3.9 -  Chloride 98 - 111 mmol/L 101 103 -  CO2 22 - 32 mmol/L 23 22 -  Calcium 8.9 - 10.3 mg/dL 5.1(V) 9.3 -  Total Protein 6.5 - 8.1 g/dL - 7.3 7.0  Total Bilirubin 0.3 - 1.2 mg/dL - 1.3(H) 0.7  Alkaline Phos 38 - 126 U/L - 78 56  AST 15 - 41 U/L - 36 21  ALT 0 - 44 U/L - 21 17     Radiology  Studies: DG Chest 2 View  Result Date: 08/28/2021 CLINICAL DATA:  Altered mental status EXAM: CHEST - 2 VIEW COMPARISON:  Chest x-ray dated June 28, 2020 FINDINGS: Cardiac and mediastinal contours are unchanged. Large hiatal hernia. Bibasilar atelectasis. Lungs otherwise clear. Inferior costophrenic angles are excluded from the field of view, no large pleural effusion. No evidence of pneumothorax. IMPRESSION: No active cardiopulmonary disease. Electronically Signed   By: Allegra Lai M.D.   On: 08/28/2021 19:54   DG Forearm Left  Result Date: 08/28/2021 CLINICAL DATA:  Left forearm swelling, erythema EXAM: LEFT FOREARM - 2 VIEW COMPARISON:  None. FINDINGS: Frontal and lateral views of the left forearm are obtained. No acute or destructive bony lesions. Alignment of the left elbow and wrist is anatomic. Marked soft tissue swelling of the proximal forearm. No subcutaneous gas or radiopaque foreign body. IMPRESSION: 1. Proximal soft tissue swelling. 2. No acute bony abnormality. Electronically Signed   By: Sharlet Salina M.D.   On: 08/28/2021 20:48   US Venous Img Upper Uni Left (DVT)  Result Date: 08/29/2021 CLINICAL DATA:  Left arm swelling EXAM: LEFT UPPER EXTREMITY VENOUS DOPPLER ULTRASOUND TECHNIQUE: Gray-scale sonography with graded compression, as well as color Doppler and duplex ultrasound were performed to evaluate the upper extremity deep venous system from the level of the subclavian vein and including the jugular, axillary, basilic, radial, ulnar and upper cephalic vein. Spectral Doppler was utilized to evaluate flow at rest and with distal augmentation maneuvers. COMPARISON:  None. FINDINGS: Contralateral Subclavian Vein: Respiratory phasicity is normal and symmetric with the symptomatic side. No evidence of thrombus. Normal compressibility. Internal Jugular Vein: No evidence of thrombus. Normal compressibility, respiratory phasicity and response to augmentation. Subclavian Vein: No  evidence of thrombus. Normal compressibility, respiratory phasicity and response to augmentation. Axillary Vein: No evidence of thrombus. Normal compressibility, respiratory phasicity and response to augmentation. Cephalic Vein: No evidence of thrombus. Normal compressibility, respiratory phasicity and response to augmentation. Basilic Vein: No evidence of thrombus. Normal compressibility, respiratory phasicity and response to augmentation. Brachial Veins: No evidence of thrombus. Normal compressibility, respiratory phasicity and response to augmentation. Radial Veins: No evidence of thrombus. Normal compressibility, respiratory phasicity and response to augmentation. Ulnar Veins: No evidence of thrombus. Normal compressibility, respiratory phasicity and response to augmentation. Venous Reflux:  None visualized. Other Findings:  None visualized. IMPRESSION: No evidence of DVT within the left upper extremity. Electronically Signed   By: Charline Bills M.D.   On: 08/29/2021 00:21   CT Extrem Up Entire Arm L W/CM  Result Date: 08/29/2021 CLINICAL DATA:  Left forearm swelling, no known injury, initial encounter EXAM: CT OF THE UPPER LEFT EXTREMITY WITH CONTRAST TECHNIQUE: Multidetector CT imaging of the upper left extremity was performed according to the standard protocol following intravenous contrast administration.  RADIATION DOSE REDUCTION: This exam was performed according to the departmental dose-optimization program which includes automated exposure control, adjustment of the mA and/or kV according to patient size and/or use of iterative reconstruction technique. CONTRAST:  64mL OMNIPAQUE IOHEXOL 350 MG/ML SOLN COMPARISON:  None. FINDINGS: Bones/Joint/Cartilage Bony structures show degenerative change of the left shoulder joint. No acute fracture or dislocation is noted. No bony erosive changes are noted. Ligaments Suboptimally assessed by CT. Muscles and Tendons Musculature is within normal limits. Soft  tissues Considerable soft tissue swelling is noted along the lateral and posterior aspect of the elbow extending into the forearm. Some subcutaneous fluid collections suggestive of blisters are noted anteriorly and laterally. No discrete abscess is noted. IMPRESSION: Considerable subcutaneous edema is identified in the region the proximal and mid forearm as well as about the elbow similar to that seen on physical exam. Blistering is noted in the skin surface similar to that seen on physical exam. No underlying bony abnormality is seen. No drainable abscess is seen. Electronically Signed   By: Alcide Clever M.D.   On: 08/29/2021 03:19     Scheduled Meds:  apixaban  2.5 mg Oral BID   melatonin  10 mg Oral QHS   metoprolol succinate  100 mg Oral Daily   pantoprazole  80 mg Oral Daily   pravastatin  40 mg Oral q1800   sodium chloride flush       sodium chloride flush       Continuous Infusions:  clindamycin (CLEOCIN) IV     lactated ringers 10 mL/hr at 08/29/21 1222   [START ON 08/30/2021] pencillin G potassium IV       LOS: 0 days   Time spent: 44  Rhetta Mura, MD [facility] To contact the attending provider between 7A-7P or the covering provider during after hours 7P-7A, please log into the web site [email or web address] and access using universal [place] password for that web site. If you do not have the password, please call the hospital operator.  08/29/2021, 5:51 PM

## 2021-08-29 NOTE — Progress Notes (Signed)
81 y o m, FC, Alert and Oriented. Calm and cooperative. Dressing change performed at start of shift.  Left upper extremity dressing changed per order. Wound appears extremely red, edematous, and dressing has old drainage. Patient expresses some pain during the dressing change. IVs flushed and Antibiotic Clindamycin  administered.

## 2021-08-30 LAB — COMPREHENSIVE METABOLIC PANEL
ALT: 21 U/L (ref 0–44)
AST: 44 U/L — ABNORMAL HIGH (ref 15–41)
Albumin: 3.1 g/dL — ABNORMAL LOW (ref 3.5–5.0)
Alkaline Phosphatase: 61 U/L (ref 38–126)
Anion gap: 9 (ref 5–15)
BUN: 35 mg/dL — ABNORMAL HIGH (ref 8–23)
CO2: 25 mmol/L (ref 22–32)
Calcium: 8.6 mg/dL — ABNORMAL LOW (ref 8.9–10.3)
Chloride: 101 mmol/L (ref 98–111)
Creatinine, Ser: 2.05 mg/dL — ABNORMAL HIGH (ref 0.61–1.24)
GFR, Estimated: 32 mL/min — ABNORMAL LOW (ref >60)
Glucose, Bld: 102 mg/dL — ABNORMAL HIGH (ref 70–99)
Potassium: 3.7 mmol/L (ref 3.5–5.1)
Sodium: 135 mmol/L (ref 135–145)
Total Bilirubin: 1 mg/dL (ref 0.3–1.2)
Total Protein: 6.3 g/dL — ABNORMAL LOW (ref 6.5–8.1)

## 2021-08-30 LAB — CBC WITH DIFFERENTIAL/PLATELET
Abs Immature Granulocytes: 0.65 10*3/uL — ABNORMAL HIGH (ref 0.00–0.07)
Basophils Absolute: 0 10*3/uL (ref 0.0–0.1)
Basophils Relative: 0 %
Eosinophils Absolute: 0 10*3/uL (ref 0.0–0.5)
Eosinophils Relative: 0 %
HCT: 34.3 % — ABNORMAL LOW (ref 39.0–52.0)
Hemoglobin: 10.5 g/dL — ABNORMAL LOW (ref 13.0–17.0)
Immature Granulocytes: 4 %
Lymphocytes Relative: 3 %
Lymphs Abs: 0.6 10*3/uL — ABNORMAL LOW (ref 0.7–4.0)
MCH: 25.5 pg — ABNORMAL LOW (ref 26.0–34.0)
MCHC: 30.6 g/dL (ref 30.0–36.0)
MCV: 83.3 fL (ref 80.0–100.0)
Monocytes Absolute: 0.7 10*3/uL (ref 0.1–1.0)
Monocytes Relative: 4 %
Neutro Abs: 15 10*3/uL — ABNORMAL HIGH (ref 1.7–7.7)
Neutrophils Relative %: 89 %
Platelets: 137 10*3/uL — ABNORMAL LOW (ref 150–400)
RBC: 4.12 MIL/uL — ABNORMAL LOW (ref 4.22–5.81)
RDW: 16.6 % — ABNORMAL HIGH (ref 11.5–15.5)
WBC: 16.9 10*3/uL — ABNORMAL HIGH (ref 4.0–10.5)
nRBC: 0 % (ref 0.0–0.2)

## 2021-08-30 LAB — URINALYSIS, MICROSCOPIC (REFLEX)
Bacteria, UA: NONE SEEN
RBC / HPF: >50 RBC/hpf (ref 0–5)
Squamous Epithelial / HPF: NONE SEEN (ref 0–5)

## 2021-08-30 LAB — URINALYSIS, ROUTINE W REFLEX MICROSCOPIC
Bilirubin Urine: NEGATIVE
Glucose, UA: NEGATIVE mg/dL
Ketones, ur: NEGATIVE mg/dL
Leukocytes,Ua: NEGATIVE
Nitrite: NEGATIVE
Protein, ur: >300 mg/dL — AB
Specific Gravity, Urine: 1.02 (ref 1.005–1.030)
pH: 5 (ref 5.0–8.0)

## 2021-08-30 LAB — SODIUM, URINE, RANDOM: Sodium, Ur: 20 mmol/L

## 2021-08-30 LAB — STREP PNEUMONIAE URINARY ANTIGEN: Strep Pneumo Urinary Antigen: NEGATIVE

## 2021-08-30 LAB — GROUP A STREP BY PCR: Group A Strep by PCR: DETECTED — AB

## 2021-08-30 LAB — CREATININE, URINE, RANDOM: Creatinine, Urine: 123 mg/dL

## 2021-08-30 MED ORDER — FINASTERIDE 5 MG PO TABS
5.0000 mg | ORAL_TABLET | Freq: Every day | ORAL | Status: DC
  Administered 2021-08-30 – 2021-09-02 (×4): 5 mg via ORAL
  Filled 2021-08-30 (×4): qty 1

## 2021-08-30 MED ORDER — SILVER SULFADIAZINE 1 % EX CREA
TOPICAL_CREAM | Freq: Three times a day (TID) | CUTANEOUS | Status: DC
  Administered 2021-08-30 (×2): 1 via TOPICAL
  Filled 2021-08-30: qty 85

## 2021-08-30 MED ORDER — TAMSULOSIN HCL 0.4 MG PO CAPS
0.4000 mg | ORAL_CAPSULE | Freq: Every day | ORAL | Status: DC
  Administered 2021-08-30 – 2021-09-02 (×4): 0.4 mg via ORAL
  Filled 2021-08-30 (×4): qty 1

## 2021-08-30 NOTE — Progress Notes (Signed)
[facility] Day(s): 1.   Post op day(s): 1 Day Post-Op.   Interval History:  Patient seen and examined No acute events or new complaints overnight.  Patient reports he is doing fine; some discomfort in left arm His leukocytosis is improved; 16.9K; no fevers Slight bump in renal function; sCr - 2.05; UO - unmeasured No significant electrolyte derangements He is on Clindamycin On regular diet  Vital signs in last 24 hours: [min-max] current  Temp:  [97.4 F (36.3 C)-99.2 F (37.3 C)] 98.1 F (36.7 C) (02/02 0732) Pulse Rate:  [62-80] 70 (02/02 0732) Resp:  [15-18] 18 (02/02 0359) BP: (126-179)/(60-93) 173/93 (02/02 0732) SpO2:  [93 %-100 %] 96 % (02/02 0732)     Height: 5\' 7"  (170.2 cm) Weight: 79.4 kg BMI (Calculated): 27.4   Intake/Output last 2 shifts:  02/01 0701 - 02/02 0700 In: 100 [I.V.:50; IV Piggyback:50] Out: -    Physical Exam:  Constitutional: alert, cooperative and no distress  Respiratory: breathing non-labored at rest  Integumentary: Recently changed dressing, at time of my evaluation previously seen surrounding erythema is improving, wound bed appears viable, no appreciable areas of necrosis or undrained abscess, he has good peripheral pulse, strength, and sensation in the extremity  Labs:  CBC Latest Ref Rng & Units 08/30/2021 08/29/2021 08/28/2021  WBC 4.0 - 10.5 K/uL 16.9(H) 20.9(H) 20.3(H)  Hemoglobin 13.0 - 17.0 g/dL 10.5(L) 10.8(L) 12.3(L)  Hematocrit 39.0 - 52.0 % 34.3(L) 35.6(L) 41.8  Platelets 150 - 400 K/uL 137(L) 124(L) 153   CMP Latest Ref Rng & Units 08/30/2021 08/29/2021 08/28/2021  Glucose 70 - 99 mg/dL 102(H) 121(H) 114(H)  BUN 8 - 23 mg/dL 35(H) 26(H) 21  Creatinine 0.61 - 1.24 mg/dL 2.05(H) 1.91(H) 1.87(H)  Sodium 135 - 145 mmol/L 135 134(L) 138  Potassium 3.5 - 5.1 mmol/L 3.7 3.8 3.9  Chloride 98 - 111 mmol/L 101 101 103  CO2 22 - 32 mmol/L 25 23 22   Calcium 8.9 - 10.3 mg/dL 8.6(L) 8.6(L) 9.3   Total Protein 6.5 - 8.1 g/dL 6.3(L) - 7.3  Total Bilirubin 0.3 - 1.2 mg/dL 1.0 - 1.3(H)  Alkaline Phos 38 - 126 U/L 61 - 78  AST 15 - 41 U/L 44(H) - 36  ALT 0 - 44 U/L 21 - 21     Imaging studies: No new pertinent imaging studies   Assessment/Plan:  81 y.o. male 1 Day Post-Op s/p excisional debridement of dermis of left forearm for GAS bacteremia and concerning for possible underlying infection   - No further surgical intervention at this time; we will of course follow closely - Continue dressing changes with silvadene; guaze, and Kerlix   - Again BCx with Strep Pyogenes; ID on board; on Clindamycin - Monitor leukocytosis; improved - Pain control prn   - Further management per primary service; we will follow    All of the above findings and recommendations were discussed with the patient, patient's family (son and daughter via telephone), and the medical team, and all of patient's and family's questions were answered to their expressed satisfaction.  -- Edison Simon, PA-C [facility] 08/30/2021, 8:22 AM [phone] M-F: 7am - 4pm

## 2021-08-30 NOTE — TOC Initial Note (Signed)
Transition of Care [facility]) - Initial/Assessment Note    Patient Details  Name: Jose Baker MRN: [id number] Date of Birth: 1941/07/13  Transition of [facility]) CM/SW Contact:    Candie Chroman, LCSW Phone Number: 08/30/2021, 12:34 PM  Clinical Narrative:  Readmission prevention screen complete. CSW met with patient. Son-in-law at bedside. CSW introduced role and explained that discharge planning would be discussed. PCP is Thereasa Distance, MD. Wife and daughter drive him to appointments. [facility]acy [facility] [place]. No issues obtaining medications. No home health/DME use prior to admission. Family will transport him home at discharge. No further concerns. CSW encouraged patient and his son-in-law to contact CSW as needed. CSW will continue to follow patient for support and facilitate return home when stable.                Expected Discharge Plan: Home/Self Care Barriers to Discharge: Continued Medical Work up   Patient Goals and CMS Choice        Expected Discharge Plan and Services Expected Discharge Plan: Home/Self Care     Post Acute Care Choice: NA Living arrangements for the past 2 months: Single Family Home                                      Prior Living Arrangements/Services Living arrangements for the past 2 months: Single Family Home Lives with:: Spouse Patient language and need for interpreter reviewed:: Yes Do you feel safe going back to the place where you live?: Yes      Need for Family Participation in Patient Care: Yes (Comment) Care giver support system in place?: Yes (comment)   Criminal Activity/Legal Involvement Pertinent to Current Situation/Hospitalization: No - Comment as needed  Activities of Daily Living Home Assistive Devices/Equipment: None ADL Screening (condition at time of admission) Patient's cognitive ability adequate to safely complete daily activities?: No Is the patient deaf or have difficulty hearing?: No Does the patient have  difficulty seeing, even when wearing glasses/contacts?: No Does the patient have difficulty concentrating, remembering, or making decisions?: Yes Patient able to express need for assistance with ADLs?: Yes Does the patient have difficulty dressing or bathing?: No Independently performs ADLs?: No Communication: Needs assistance Is this a change from baseline?: Pre-admission baseline Dressing (OT): Needs assistance Is this a change from baseline?: Pre-admission baseline Grooming: Needs assistance Feeding: Needs assistance Is this a change from baseline?: Pre-admission baseline Bathing: Needs assistance Is this a change from baseline?: Pre-admission baseline Toileting: Needs assistance Is this a change from baseline?: Pre-admission baseline In/Out Bed: Needs assistance Is this a change from baseline?: Pre-admission baseline Walks in Home: Needs assistance Is this a change from baseline?: Pre-admission baseline Does the patient have difficulty walking or climbing stairs?: Yes Weakness of Legs: Both Weakness of Arms/Hands: Both  Permission Sought/Granted Permission sought to share information with : Family Supports          Permission granted to share info w Relationship: Son-in-law     Emotional Assessment Appearance:: Appears stated age Attitude/Demeanor/Rapport: Engaged, Gracious Affect (typically observed): Accepting, Appropriate, Calm, Pleasant Orientation: : Oriented to Self, Oriented to Place, Oriented to  Time, Oriented to Situation Alcohol / Substance Use: Not Applicable Psych Involvement: No (comment)  Admission diagnosis:  Cellulitis of left upper extremity [L03.114] Left arm swelling [M79.89] Sepsis without acute organ dysfunction, due to unspecified organism [facility]) [A41.9] Patient Active Problem List  Diagnosis Date Noted   Hypophosphatemia 08/29/2021   Left arm swelling 08/28/2021   Stage 3b chronic kidney disease (CKD) ([place]) 08/28/2021   Nonrheumatic aortic  valve insufficiency 09/01/2019   Benign prostatic hyperplasia without lower urinary tract symptoms 12/04/2017   CKD (chronic kidney disease) stage 3, GFR 30-59 ml/min (HCC) 12/04/2017   Paroxysmal atrial fibrillation ([place]) 08/13/2016   Arthritis 08/12/2016   OSA (obstructive sleep apnea) 08/12/2016   Cerebral aneurysm without rupture 04/26/2016   GERD (gastroesophageal reflux disease) 03/07/2014   Hyperlipidemia 03/07/2014   Hypertension 03/07/2014   PCP:  Sofie Hartigan, MD Pharmacy[facility]   [id number] [place] [place] [address]Phone: [phone]: [phone]    Social Determinants of Health (SDOH) Interventions    Readmission Risk Interventions Readmission Risk Prevention Plan 08/30/2021  Transportation Screening Complete  PCP or Specialist Appt within 3-5 Days Complete  Social Work Consult for [place]sel[place] Complete  Palliative Care Screening Not Applicable  Medication Review (Press photographer Complete  Some recent data might be hidden

## 2021-08-30 NOTE — Progress Notes (Signed)
ID Patient doing well Sitting in chair States he wants to go home Son at bedside  On examination awake and alert.  Not oriented in place of time Patient Vitals for the past 24 hrs:  BP Temp Temp src Pulse Resp SpO2  08/30/21 2017 (!) 188/108 98.4 F (36.9 C) -- 62 18 99 %  08/30/21 1930 -- -- -- -- -- 99 %  08/30/21 1601 (!) 173/89 -- -- 67 -- 98 %  08/30/21 1400 (!) 168/80 98.6 F (37 C) Oral 82 (!) 24 98 %  08/30/21 1141 (!) 172/85 98.4 F (36.9 C) Oral 76 -- 97 %  08/30/21 0732 (!) 173/93 98.1 F (36.7 C) -- 70 -- 96 %  08/30/21 0359 (!) 151/77 98.5 F (36.9 C) -- 62 18 96 %  08/29/21 2336 126/62 99.2 F (37.3 C) -- 72 17 95 %  Chest clear to auscultate Heart sound S1-S2 Abdomen soft Left arm surgical dressing not removed CNS grossly nonfocal  Labs CBC Latest Ref Rng & Units 08/30/2021 08/29/2021 08/28/2021  WBC 4.0 - 10.5 K/uL 16.9(H) 20.9(H) 20.3(H)  Hemoglobin 13.0 - 17.0 g/dL 10.5(L) 10.8(L) 12.3(L)  Hematocrit 39.0 - 52.0 % 34.3(L) 35.6(L) 41.8  Platelets 150 - 400 K/uL 137(L) 124(L) 153    CMP Latest Ref Rng & Units 08/30/2021 08/29/2021 08/28/2021  Glucose 70 - 99 mg/dL 102(H) 121(H) 114(H)  BUN 8 - 23 mg/dL 35(H) 26(H) 21  Creatinine 0.61 - 1.24 mg/dL 2.05(H) 1.91(H) 1.87(H)  Sodium 135 - 145 mmol/L 135 134(L) 138  Potassium 3.5 - 5.1 mmol/L 3.7 3.8 3.9  Chloride 98 - 111 mmol/L 101 101 103  CO2 22 - 32 mmol/L 25 23 22   Calcium 8.9 - 10.3 mg/dL 8.6(L) 8.6(L) 9.3  Total Protein 6.5 - 8.1 g/dL 6.3(L) - 7.3  Total Bilirubin 0.3 - 1.2 mg/dL 1.0 - 1.3(H)  Alkaline Phos 38 - 126 U/L 61 - 78  AST 15 - 41 U/L 44(H) - 36  ALT 0 - 44 U/L 21 - 21     Impression/recommendation Invasive group A streptococcus infection with bacteremia and necrotizing cellulitis of left forearm.  Status post debridement Leukocytosis improving Currently on penicillin and clindamycin Will need minimum of 1 week of IV antibiotics followed by oral antibiotic Present repeat blood  cultures   UTI  A. fib rate well controlled on metoprolol and is also on Eliquis  Neurocognitive impairment  Discussed the management with the patient and his son and the hospitalist.

## 2021-08-30 NOTE — Progress Notes (Signed)
Requested a tube of Silvadene from Pharmacy to complete a dressing change order for a debrided wound in patient's left forearm.

## 2021-08-30 NOTE — Progress Notes (Addendum)
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 Rush Landmark 08/29/2021--defer further local WC to them, and OP f/u Abx/ ID:- penicillin G 3 million every 4, clindamycin 900 every 8 Pain control Norco 1 every 6 as needed Stop IVF/Stop Cardiac monitor, is improved some from admit GAS/+ Strep throat  GAS is +, await cult Paroxysmal A. fib on Eliquis CHADS2 score >4 Continue metoprolol XL 100, Eliquis 2.5 twice daily given age AKI on admission Baseline creatinine about 1.4 Bladder scan =250 CC--Has Prostatism, had I/o cath x 1 on 08/30/21 start flomax 0.4, continue finasteride 5mg  Phosphorus replaced aggressively earlier in admission OSA on CPAP-noncompliant Will need titration and reassessment in the outpatient setting HTN From PTA isnt taking amlodipine 2.5 lisinopril  Mild dementia   DVT prophylaxis: Eliquis Code Status: Full Family Communication: Discussed with patient's daughter-in-law (former ) Disposition:  Status is: Inpatient Remains inpatient appropriate because: Postop state     Consultants:  Infectious disease General  surgery  Procedures: I&D 2/1  Antimicrobials: Clindamycin penicillin   Subjective:  Awake pleasant in nad no focal deficit Tol diet Bladder scan was + , had I/o   Objective: Vitals:   08/29/21 2336 08/30/21 0359 08/30/21 0732 08/30/21 1141  BP: 126/62 (!) 151/77 (!) 173/93 (!) 187/96  Pulse: 72 62 70 76  Resp: 17 18    Temp: 99.2 F (37.3 C) 98.5 F (36.9 C) 98.1 F (36.7 C)   TempSrc:      SpO2: 95% 96% 96% 97%  Weight:      Height:        Intake/Output Summary (Last 24 hours) at 08/30/2021 1156 Last data filed at 08/30/2021 1040 Gross per 24 hour  Intake 100 ml  Output 275 ml  Net -175 ml    Filed Weights   08/28/21 1920  Weight: 79.4 kg    Examination:  Awake coherent in nad thick neck mallampatti 2 Cta b no rales rhonchi no wheeze Abd sodft nt dn no rebound Area on L arm larger No Le edema ROM intact power 5/5  Data Reviewed: personally reviewed   CBC    Component Value Date/Time   WBC 16.9 (H) 08/30/2021 0638   RBC 4.12 (L) 08/30/2021 0638   HGB 10.5 (L) 08/30/2021 0638   HCT 34.3 (L) 08/30/2021 0638   PLT 137 (L) 08/30/2021 0638   MCV 83.3 08/30/2021 0638   MCH 25.5 (L) 08/30/2021 0638   MCHC 30.6 08/30/2021 0638   RDW 16.6 (H) 08/30/2021 0638   LYMPHSABS 0.6 (L) 08/30/2021 0638   MONOABS 0.7 08/30/2021 0638   EOSABS 0.0 08/30/2021 0638   BASOSABS 0.0 08/30/2021 0638   CMP Latest Ref Rng & Units 08/30/2021 08/29/2021 08/28/2021  Glucose 70 - 99 mg/dL 158(X) 094(M) 768(G)  BUN 8 - 23 mg/dL 88(P) 10(R) 21  Creatinine 0.61 - 1.24 mg/dL 1.59(Y) 5.85(F) 2.92(K)  Sodium 135 - 145 mmol/L 135 134(L) 138  Potassium 3.5 - 5.1 mmol/L 3.7 3.8 3.9  Chloride 98 - 111 mmol/L 101 101 103  CO2 22 - 32 mmol/L 25 23 22   Calcium 8.9 - 10.3 mg/dL 4.6(K) 8.6(N) 9.3  Total Protein 6.5 - 8.1 g/dL 6.3(L) - 7.3  Total Bilirubin 0.3 - 1.2 mg/dL 1.0 - 1.3(H)  Alkaline Phos 38 - 126 U/L 61 - 78  AST 15 - 41 U/L 44(H) - 36  ALT 0 - 44 U/L 21 - 21     Radiology  Studies: DG Chest 2 View  Result Date: 08/28/2021 CLINICAL DATA:  Altered mental status EXAM: CHEST - 2 VIEW COMPARISON:  Chest x-ray dated June 28, 2020 FINDINGS: Cardiac and mediastinal contours are unchanged. Large hiatal hernia. Bibasilar atelectasis. Lungs otherwise clear. Inferior costophrenic angles are excluded from the field of view, no large pleural effusion. No evidence of pneumothorax. IMPRESSION: No active cardiopulmonary disease. Electronically Signed   By: Allegra Lai M.D.   On: 08/28/2021 19:54   DG Forearm Left  Result Date: 08/28/2021 CLINICAL DATA:  Left forearm swelling, erythema EXAM: LEFT FOREARM - 2 VIEW COMPARISON:  None. FINDINGS: Frontal and lateral views of the left forearm are obtained. No acute or destructive bony lesions. Alignment of the left elbow and wrist is anatomic. Marked soft tissue swelling of the proximal forearm. No subcutaneous gas or radiopaque foreign body. IMPRESSION: 1. Proximal soft tissue swelling. 2. No acute bony abnormality. Electronically Signed   By: Sharlet Salina M.D.   On: 08/28/2021 20:48   US Venous Img Upper Uni Left (DVT)  Result Date: 08/29/2021 CLINICAL DATA:  Left arm swelling EXAM: LEFT UPPER EXTREMITY VENOUS DOPPLER ULTRASOUND TECHNIQUE: Gray-scale sonography with graded compression, as well as color Doppler and duplex ultrasound were performed to evaluate the upper extremity deep venous system from the level of the subclavian vein and including the jugular, axillary, basilic, radial, ulnar and upper cephalic vein. Spectral Doppler was utilized to evaluate flow at rest and with distal augmentation maneuvers. COMPARISON:  None. FINDINGS: Contralateral Subclavian Vein: Respiratory phasicity is normal and symmetric with the symptomatic side. No evidence of thrombus. Normal compressibility. Internal Jugular Vein: No evidence of thrombus. Normal compressibility, respiratory phasicity and response to augmentation. Subclavian Vein: No  evidence of thrombus. Normal compressibility, respiratory phasicity and response to augmentation. Axillary Vein: No evidence of thrombus. Normal compressibility, respiratory phasicity and response to augmentation. Cephalic Vein: No evidence of thrombus. Normal compressibility, respiratory phasicity and response to augmentation. Basilic Vein: No evidence of thrombus. Normal compressibility, respiratory phasicity and response to augmentation. Brachial Veins: No evidence of thrombus. Normal compressibility, respiratory phasicity and response to augmentation. Radial Veins: No evidence of thrombus. Normal compressibility, respiratory phasicity and response to augmentation. Ulnar Veins: No evidence of thrombus. Normal compressibility, respiratory phasicity and response to augmentation. Venous Reflux:  None visualized. Other Findings:  None visualized. IMPRESSION: No evidence of DVT within the left upper extremity. Electronically Signed   By: Charline Bills M.D.   On: 08/29/2021 00:21   CT Extrem Up Entire Arm L W/CM  Result Date: 08/29/2021 CLINICAL DATA:  Left forearm swelling, no known injury, initial encounter EXAM: CT OF THE UPPER LEFT EXTREMITY WITH CONTRAST TECHNIQUE: Multidetector CT imaging of the upper left extremity was performed according to the standard protocol following  intravenous contrast administration. RADIATION DOSE REDUCTION: This exam was performed according to the departmental dose-optimization program which includes automated exposure control, adjustment of the mA and/or kV according to patient size and/or use of iterative reconstruction technique. CONTRAST:  78mL OMNIPAQUE IOHEXOL 350 MG/ML SOLN COMPARISON:  None. FINDINGS: Bones/Joint/Cartilage Bony structures show degenerative change of the left shoulder joint. No acute fracture or dislocation is noted. No bony erosive changes are noted. Ligaments Suboptimally assessed by CT. Muscles and Tendons Musculature is within normal limits. Soft  tissues Considerable soft tissue swelling is noted along the lateral and posterior aspect of the elbow extending into the forearm. Some subcutaneous fluid collections suggestive of blisters are noted anteriorly and laterally. No discrete abscess is noted. IMPRESSION: Considerable subcutaneous edema is identified in the region the proximal and mid forearm as well as about the elbow similar to that seen on physical exam. Blistering is noted in the skin surface similar to that seen on physical exam. No underlying bony abnormality is seen. No drainable abscess is seen. Electronically Signed   By: Alcide Clever M.D.   On: 08/29/2021 03:19     Scheduled Meds:  apixaban  2.5 mg Oral BID   finasteride  5 mg Oral Daily   melatonin  10 mg Oral QHS   metoprolol succinate  100 mg Oral Daily   pantoprazole  80 mg Oral Daily   pravastatin  40 mg Oral q1800   silver sulfADIAZINE   Topical Q8H   tamsulosin  0.4 mg Oral Daily   Continuous Infusions:  clindamycin (CLEOCIN) IV 900 mg (08/30/21 0511)   pencillin G potassium IV 3 Million Units (08/30/21 1148)     LOS: 1 day   Time spent: 24  Rhetta Mura, MD [facility] To contact the attending provider between 7A-7P or the covering provider during after hours 7P-7A, please log into the web site [email or web address] and access using universal  password for that web site. If you do not have the password, please call the hospital operator.  08/30/2021, 11:56 AM

## 2021-08-30 NOTE — Progress Notes (Signed)
New bag of LR given continuous at 150 ml/hr.

## 2021-08-30 NOTE — Progress Notes (Signed)
Dressing change done at 6:25am

## 2021-08-30 NOTE — Plan of Care (Signed)
Spoke with Daughter-in-law/also in med field,  via phone who informed of prostate issues.  Gave ok for in/out but really doesn't want consider foley (if possible to avoid).  Did have slight resistance but nothing major. Caused minimal pink drainage at tip - but stopped on its own. Dr. Informed 275cc removed.

## 2021-08-31 ENCOUNTER — Encounter: Payer: Self-pay | Admitting: Internal Medicine

## 2021-08-31 DIAGNOSIS — I4891 Unspecified atrial fibrillation: Secondary | ICD-10-CM

## 2021-08-31 DIAGNOSIS — G3184 Mild cognitive impairment, so stated: Secondary | ICD-10-CM

## 2021-08-31 DIAGNOSIS — I96 Gangrene, not elsewhere classified: Secondary | ICD-10-CM

## 2021-08-31 LAB — COMPREHENSIVE METABOLIC PANEL
ALT: 21 U/L (ref 0–44)
AST: 37 U/L (ref 15–41)
Albumin: 3.1 g/dL — ABNORMAL LOW (ref 3.5–5.0)
Alkaline Phosphatase: 58 U/L (ref 38–126)
Anion gap: 10 (ref 5–15)
BUN: 40 mg/dL — ABNORMAL HIGH (ref 8–23)
CO2: 23 mmol/L (ref 22–32)
Calcium: 8.5 mg/dL — ABNORMAL LOW (ref 8.9–10.3)
Chloride: 101 mmol/L (ref 98–111)
Creatinine, Ser: 1.85 mg/dL — ABNORMAL HIGH (ref 0.61–1.24)
GFR, Estimated: 36 mL/min — ABNORMAL LOW (ref >60)
Glucose, Bld: 97 mg/dL (ref 70–99)
Potassium: 3.7 mmol/L (ref 3.5–5.1)
Sodium: 134 mmol/L — ABNORMAL LOW (ref 135–145)
Total Bilirubin: 0.7 mg/dL (ref 0.3–1.2)
Total Protein: 6.4 g/dL — ABNORMAL LOW (ref 6.5–8.1)

## 2021-08-31 LAB — CBC WITH DIFFERENTIAL/PLATELET
Abs Immature Granulocytes: 0.11 10*3/uL — ABNORMAL HIGH (ref 0.00–0.07)
Basophils Absolute: 0 10*3/uL (ref 0.0–0.1)
Basophils Relative: 0 %
Eosinophils Absolute: 0.2 10*3/uL (ref 0.0–0.5)
Eosinophils Relative: 1 %
HCT: 30.8 % — ABNORMAL LOW (ref 39.0–52.0)
Hemoglobin: 9.6 g/dL — ABNORMAL LOW (ref 13.0–17.0)
Immature Granulocytes: 1 %
Lymphocytes Relative: 4 %
Lymphs Abs: 0.5 10*3/uL — ABNORMAL LOW (ref 0.7–4.0)
MCH: 25.7 pg — ABNORMAL LOW (ref 26.0–34.0)
MCHC: 31.2 g/dL (ref 30.0–36.0)
MCV: 82.6 fL (ref 80.0–100.0)
Monocytes Absolute: 0.5 10*3/uL (ref 0.1–1.0)
Monocytes Relative: 4 %
Neutro Abs: 10.5 10*3/uL — ABNORMAL HIGH (ref 1.7–7.7)
Neutrophils Relative %: 90 %
Platelets: 140 10*3/uL — ABNORMAL LOW (ref 150–400)
RBC: 3.73 MIL/uL — ABNORMAL LOW (ref 4.22–5.81)
RDW: 16.5 % — ABNORMAL HIGH (ref 11.5–15.5)
WBC: 11.8 10*3/uL — ABNORMAL HIGH (ref 4.0–10.5)
nRBC: 0 % (ref 0.0–0.2)

## 2021-08-31 LAB — CULTURE, BLOOD (ROUTINE X 2)

## 2021-08-31 LAB — HEMOGLOBIN AND HEMATOCRIT, BLOOD
HCT: 32.4 % — ABNORMAL LOW (ref 39.0–52.0)
Hemoglobin: 9.9 g/dL — ABNORMAL LOW (ref 13.0–17.0)

## 2021-08-31 MED ORDER — SODIUM CHLORIDE 0.9 % IV SOLN
2.0000 g | Freq: Every day | INTRAVENOUS | Status: DC
  Administered 2021-08-31 – 2021-09-02 (×3): 2 g via INTRAVENOUS
  Filled 2021-08-31: qty 20
  Filled 2021-08-31: qty 2
  Filled 2021-08-31: qty 20

## 2021-08-31 NOTE — Progress Notes (Signed)
[facility] Day(s): 2.   Post op day(s): 2 Days Post-Op.   Interval History:  Patient seen and examined No acute events or new complaints overnight.  Patient reports he is doing fine; some discomfort in left arm; improving some No fever, chills Leukocytosis continues to improve; down to 11.8K Renal function improved some this morning; sCr - 1.85 Continues on IV Clindamycin; Pen G added on 02/02; ID on board On regular diet  Vital signs in last 24 hours: [min-max] current  Temp:  [98.3 F (36.8 C)-98.6 F (37 C)] 98.3 F (36.8 C) (02/03 0400) Pulse Rate:  [62-82] 72 (02/03 0400) Resp:  [17-24] 17 (02/03 0400) BP: (168-188)/(74-108) 179/81 (02/03 0400) SpO2:  [97 %-99 %] 97 % (02/03 0400)     Height: 5\' 7"  (170.2 cm) Weight: 79.4 kg BMI (Calculated): 27.4   Intake/Output last 2 shifts:  02/02 0701 - 02/03 0700 In: 1421.9 [I.V.:1002.7; IV Piggyback:419.2] Out: 275    Physical Exam:  Constitutional: alert, cooperative and no distress  Respiratory: breathing non-labored at rest  Integumentary: Recently changed dressing. Family at bedside had picture of the wound which I reviewed; wound bed appears healthy s/p excision of dermal bullae, no evidence of necrosis or abscess. My evaluation today continues to show previously seen surrounding erythema is improving, he has good peripheral pulse, strength, and sensation in the extremity  Labs:  CBC Latest Ref Rng & Units 08/30/2021 08/29/2021 08/28/2021  WBC 4.0 - 10.5 K/uL 16.9(H) 20.9(H) 20.3(H)  Hemoglobin 13.0 - 17.0 g/dL 10.5(L) 10.8(L) 12.3(L)  Hematocrit 39.0 - 52.0 % 34.3(L) 35.6(L) 41.8  Platelets 150 - 400 K/uL 137(L) 124(L) 153   CMP Latest Ref Rng & Units 08/30/2021 08/29/2021 08/28/2021  Glucose 70 - 99 mg/dL 102(H) 121(H) 114(H)  BUN 8 - 23 mg/dL 35(H) 26(H) 21  Creatinine 0.61 - 1.24 mg/dL 2.05(H) 1.91(H) 1.87(H)  Sodium 135 - 145 mmol/L 135 134(L) 138  Potassium 3.5 - 5.1  mmol/L 3.7 3.8 3.9  Chloride 98 - 111 mmol/L 101 101 103  CO2 22 - 32 mmol/L 25 23 22   Calcium 8.9 - 10.3 mg/dL 8.6(L) 8.6(L) 9.3  Total Protein 6.5 - 8.1 g/dL 6.3(L) - 7.3  Total Bilirubin 0.3 - 1.2 mg/dL 1.0 - 1.3(H)  Alkaline Phos 38 - 126 U/L 61 - 78  AST 15 - 41 U/L 44(H) - 36  ALT 0 - 44 U/L 21 - 21     Imaging studies: No new pertinent imaging studies   Assessment/Plan:  80 y.o. male 2 Days Post-Op s/p excisional debridement of dermis of left forearm for GAS bacteremia and concerning for possible underlying infection   - No further surgical intervention at this time; we will of course follow closely - Continue dressing changes with silvadene; guaze, and Kerlix   - Again BCx with Strep Pyogenes; ID on board; on Clindamycin + Pen G - Monitor leukocytosis; improved; nearly resolved - Pain control prn   - Further management per primary service; we will follow    All of the above findings and recommendations were discussed with the patient, patient's family at bedside, and the medical team, and all of patient's and family's questions were answered to their expressed satisfaction.  -- Edison Simon, PA-C [facility] 08/31/2021, 7:55 AM [phone] M-F: 7am - 4pm

## 2021-08-31 NOTE — TOC Progression Note (Signed)
Transition of Care [facility]) - Progression Note    Patient Details  Name: Jose Baker MRN: [id number] Date of Birth: July 25, 1941  Transition of [facility]) CM/SW Contact  Margarito Liner, LCSW Phone Number: 08/31/2021, 1:20 PM  Clinical Narrative:  Per progression rounds report, patient may discharge on IV abx. Notified Jeri Modena with [facility].  Expected Discharge Plan: Home/Self Care Barriers to Discharge: Continued Medical Work up  Expected Discharge Plan and Services Expected Discharge Plan: Home/Self Care     Post Acute Care Choice: NA Living arrangements for the past 2 months: Single Family Home                                       Social Determinants of Health (SDOH) Interventions    Readmission Risk Interventions Readmission Risk Prevention Plan 08/30/2021  Transportation Screening Complete  PCP or Specialist Appt within 3-5 Days Complete  Social Work Consult for Recovery Care Planning/Counseling Complete  Palliative Care Screening Not Applicable  Medication Review [employer]) Complete  Some recent data might be hidden

## 2021-08-31 NOTE — Progress Notes (Signed)
Patient is an 50 yom FC, comes in from home complaining of left arm swelling. Wound is present and dressing change completed per orders. V/S are stable. Patient is alert and oriented by 3 but occasionally shows signs of confusion. He is easily reoriented. He is on IV antibiotics Penicillin and Clindamycin. CBC drawn this morning shows increased WBC at 16.9. He is Afib which is controlled and is currently not on a monitor.

## 2021-08-31 NOTE — Progress Notes (Signed)
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 Rush Landmark 08/29/2021--defer further local WC to them, and OP f/u Abx/ ID:- penicillin G 3 million every 4, clindamycin 900 every 8- BC x 2 repeat 08/31/2021??  Placement of midline/PICC line pending clearance of bloodstream per ID Pain controlled on Norco 1 every 6 as needed GAS/+ Strep throat  GAS is +, blood culture + 1/31-treatment as above Paroxysmal A. 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 CC I/o cath x 1 on 08/30/21 start flomax 0.4, continue finasteride 5mg  Patient is passing good urine at this time Phosphorus replaced aggressively earlier in admission OSA on CPAP-noncompliant Will need titration and reassessment in the outpatient setting HTN From PTA isnt taking amlodipine 2.5 lisinopril  either Mild dementia   DVT prophylaxis: Eliquis Code Status: Full Family Communication: Discussed with patient's daughter-in-law [phone] (former nurse) in detail at the bedside on 08/31/2021 Disposition:  Status is: Inpatient Remains inpatient appropriate because: Postop state     Consultants:  Infectious disease General surgery  Procedures: I&D 2/1  Antimicrobials: Clindamycin penicillin   Subjective:  Appears well Pain seems controlled No chest pain no fever Eating and drinking Voiding okay after in and out cath on 2/2    Objective: Vitals:   08/31/21 0026 08/31/21 0400 08/31/21 0814 08/31/21 1223  BP: (!) 176/74 (!) 179/81 (!) 176/84 (!) 186/91  Pulse: 74 72 61 67  Resp: 18 17 14 16   Temp:  98.3 F (36.8 C) 97.9 F (36.6 C) 97.7 F (36.5 C)  TempSrc:      SpO2: 98% 97% 98% 99%  Weight:      Height:        Intake/Output Summary (Last 24 hours) at 08/31/2021 1306 Last data filed at 08/31/2021 0325 Gross per 24 hour  Intake 1421.9 ml  Output --  Net 1421.9 ml    Filed Weights   08/28/21 1920  Weight: 79.4 kg    Examination:  EOMI NCAT no focal deficit thick neck Chest clear no wheeze rales rhonchi Abdomen obese nontender no rebound I did not examined the wound in the left upper extremity Neurologically is intact congruent power is 5/5, reflexes and sensory deferred Psych is euthymic  Data Reviewed: personally reviewed   CBC    Component Value Date/Time   WBC 11.8 (H) 08/31/2021 0607   RBC 3.73 (L) 08/31/2021 0607   HGB 9.6 (L) 08/31/2021 0607   HCT 30.8 (L) 08/31/2021 0607   PLT 140 (L) 08/31/2021 0607   MCV 82.6 08/31/2021 0607  MCH 25.7 (L) 08/31/2021 0607   MCHC 31.2 08/31/2021 0607   RDW 16.5 (H) 08/31/2021 0607   LYMPHSABS 0.5 (L) 08/31/2021 0607   MONOABS 0.5 08/31/2021 0607   EOSABS 0.2 08/31/2021 0607   BASOSABS 0.0 08/31/2021 0607   CMP Latest Ref Rng & Units 08/31/2021 08/30/2021 08/29/2021  Glucose 70 - 99 mg/dL 97 240(X) 735(H)  BUN 8 - 23 mg/dL 29(J) 24(Q) 68(T)  Creatinine 0.61 - 1.24 mg/dL 4.19(Q) 2.22(L) 7.98(X)  Sodium 135 - 145 mmol/L  134(L) 135 134(L)  Potassium 3.5 - 5.1 mmol/L 3.7 3.7 3.8  Chloride 98 - 111 mmol/L 101 101 101  CO2 22 - 32 mmol/L 23 25 23   Calcium 8.9 - 10.3 mg/dL ) 2.1(J) 9.4(R)  Total Protein 6.5 - 8.1 g/dL 6.4(L) 6.3(L) -  Total Bilirubin 0.3 - 1.2 mg/dL 0.7 1.0 -  Alkaline Phos 38 - 126 U/L 58 61 -  AST 15 - 41 U/L 37 44(H) -  ALT 0 - 44 U/L 21 21 -     Radiology Studies: No results found.   Scheduled Meds:  apixaban  2.5 mg Oral BID   finasteride  5 mg Oral Daily   melatonin  10 mg Oral QHS   metoprolol succinate  100 mg Oral Daily   pantoprazole  80 mg Oral Daily   pravastatin  40 mg Oral q1800   silver sulfADIAZINE   Topical Q8H   tamsulosin  0.4 mg Oral Daily   Continuous Infusions:  clindamycin (CLEOCIN) IV 900 mg (08/31/21 0540)   pencillin G potassium IV 3 Million Units (08/31/21 1046)     LOS: 2 days   Time spent: 101  25, MD [facility] To contact the attending provider between 7A-7P or the covering provider during after hours 7P-7A, please log into the web site [email or web address] and access using universal [place] password for that web site. If you do not have the password, please call the hospital operator.  08/31/2021, 1:06 PM

## 2021-08-31 NOTE — Progress Notes (Signed)
ID Daughter at bedside Patient is doing okay No fever Pain left arm much better No nausea or vomiting No diarrhea Appetite fair  On examination awake and alert Patient Vitals for the past 24 hrs:  BP Temp Pulse Resp SpO2  08/31/21 1548 (!) 180/93 97.7 F (36.5 C) 62 16 98 %  08/31/21 1223 (!) 186/91 97.7 F (36.5 C) 67 16 99 %  08/31/21 0814 (!) 176/84 97.9 F (36.6 C) 61 14 98 %  08/31/21 0400 (!) 179/81 98.3 F (36.8 C) 72 17 97 %  08/31/21 0026 (!) 176/74 -- 74 18 98 %  08/30/21 2017 (!) 188/108 98.4 F (36.9 C) 62 18 99 %  Lateral air entry.  Clear to auscultation. Heart sound S1  Abdomen soft Chest left arm dressing removed and wound examined there is some erythema expanding upwards beyond the elbow The wound which has been debrided looks clean      CNS non focal  Labs CBC Latest Ref Rng & Units 08/31/2021 08/30/2021 08/29/2021  WBC 4.0 - 10.5 K/uL 11.8(H) 16.9(H) 20.9(H)  Hemoglobin 13.0 - 17.0 g/dL 9.6(L) 10.5(L) 10.8(L)  Hematocrit 39.0 - 52.0 % 30.8(L) 34.3(L) 35.6(L)  Platelets 150 - 400 K/uL 140(L) 137(L) 124(L)     CMP Latest Ref Rng & Units 08/31/2021 08/30/2021 08/29/2021  Glucose 70 - 99 mg/dL 97 102(H) 121(H)  BUN 8 - 23 mg/dL 40(H) 35(H) 26(H)  Creatinine 0.61 - 1.24 mg/dL 1.85(H) 2.05(H) 1.91(H)  Sodium 135 - 145 mmol/L 134(L) 135 134(L)  Potassium 3.5 - 5.1 mmol/L 3.7 3.7 3.8  Chloride 98 - 111 mmol/L 101 101 101  CO2 22 - 32 mmol/L 23 25 23   Calcium 8.9 - 10.3 mg/dL 8.5(L) 8.6(L) 8.6(L)  Total Protein 6.5 - 8.1 g/dL 6.4(L) 6.3(L) -  Total Bilirubin 0.3 - 1.2 mg/dL 0.7 1.0 -  Alkaline Phos 38 - 126 U/L 58 61 -  AST 15 - 41 U/L 37 44(H) -  ALT 0 - 44 U/L 21 21 -     Micro 08/28/2021 blood culture group A streptococcus 08/31/2021 blood culture sent  Impression/recommendation Invasive group A streptococcus infection. Has bacteremia and left forearm necrotizing soft tissue infection.  Status post debridement Patient is on penicillin and clindamycin  missed a dose of penicillin Will change penicillin to ceftriaxone for the ease of administration as it is once a day instead of every 4. chances of missing dose unlikely. Continue clindamycin Patient will need IV antibiotics for another 48 to 72 hours.  A. fib well-controlled on metoprolol and is also on Eliquis.  Neurocognitive impairment with some memory loss.  Discussed the management with the daughter at bedside.  Discussed with hospitalist.

## 2021-08-31 NOTE — Care Management Important Message (Signed)
Important Message  Patient Details  Name: Jose Baker MRN: [id number] Date of Birth: [birth year]   Medicare Important Message Given:  N/A - LOS <3 / Initial given by admissions     [name] 08/31/2021, 8:53 AM

## 2021-09-01 LAB — COMPREHENSIVE METABOLIC PANEL
ALT: 23 U/L (ref 0–44)
AST: 35 U/L (ref 15–41)
Albumin: 2.6 g/dL — ABNORMAL LOW (ref 3.5–5.0)
Alkaline Phosphatase: 56 U/L (ref 38–126)
Anion gap: 7 (ref 5–15)
BUN: 34 mg/dL — ABNORMAL HIGH (ref 8–23)
CO2: 26 mmol/L (ref 22–32)
Calcium: 8.3 mg/dL — ABNORMAL LOW (ref 8.9–10.3)
Chloride: 105 mmol/L (ref 98–111)
Creatinine, Ser: 1.87 mg/dL — ABNORMAL HIGH (ref 0.61–1.24)
GFR, Estimated: 36 mL/min — ABNORMAL LOW (ref >60)
Glucose, Bld: 101 mg/dL — ABNORMAL HIGH (ref 70–99)
Potassium: 3.7 mmol/L (ref 3.5–5.1)
Sodium: 138 mmol/L (ref 135–145)
Total Bilirubin: 0.4 mg/dL (ref 0.3–1.2)
Total Protein: 6 g/dL — ABNORMAL LOW (ref 6.5–8.1)

## 2021-09-01 LAB — CBC WITH DIFFERENTIAL/PLATELET
Abs Immature Granulocytes: 0.05 10*3/uL (ref 0.00–0.07)
Basophils Absolute: 0 10*3/uL (ref 0.0–0.1)
Basophils Relative: 0 %
Eosinophils Absolute: 0.2 10*3/uL (ref 0.0–0.5)
Eosinophils Relative: 3 %
HCT: 31.4 % — ABNORMAL LOW (ref 39.0–52.0)
Hemoglobin: 9.6 g/dL — ABNORMAL LOW (ref 13.0–17.0)
Immature Granulocytes: 1 %
Lymphocytes Relative: 9 %
Lymphs Abs: 0.7 10*3/uL (ref 0.7–4.0)
MCH: 25.5 pg — ABNORMAL LOW (ref 26.0–34.0)
MCHC: 30.6 g/dL (ref 30.0–36.0)
MCV: 83.5 fL (ref 80.0–100.0)
Monocytes Absolute: 0.5 10*3/uL (ref 0.1–1.0)
Monocytes Relative: 6 %
Neutro Abs: 6 10*3/uL (ref 1.7–7.7)
Neutrophils Relative %: 81 %
Platelets: 130 10*3/uL — ABNORMAL LOW (ref 150–400)
RBC: 3.76 MIL/uL — ABNORMAL LOW (ref 4.22–5.81)
RDW: 16.4 % — ABNORMAL HIGH (ref 11.5–15.5)
WBC: 7.4 10*3/uL (ref 4.0–10.5)
nRBC: 0 % (ref 0.0–0.2)

## 2021-09-01 MED ORDER — AMLODIPINE BESYLATE 5 MG PO TABS
5.0000 mg | ORAL_TABLET | Freq: Every day | ORAL | Status: DC
  Administered 2021-09-01 – 2021-09-02 (×2): 5 mg via ORAL
  Filled 2021-09-01 (×2): qty 1

## 2021-09-01 MED ORDER — SODIUM CHLORIDE 0.9 % IV SOLN
900.0000 mg | Freq: Three times a day (TID) | INTRAVENOUS | Status: DC
  Filled 2021-09-01: qty 6

## 2021-09-01 NOTE — Progress Notes (Signed)
ID Patient is doing better No fever Pain left arm much better No nausea or vomiting No diarrhea Appetite fair Wanting to go home  On examination awake and alert Patient Vitals for the past 24 hrs:  BP Temp Pulse Resp SpO2  09/01/21 1520 -- -- (!) 137 -- 98 %  09/01/21 1517 (!) 150/80 97.9 F (36.6 C) 62 18 --  09/01/21 1242 (!) 150/86 -- (!) 58 -- 100 %  09/01/21 1232 (!) 168/122 97.9 F (36.6 C) 75 18 100 %  09/01/21 0738 (!) 168/88 98.2 F (36.8 C) 61 16 98 %  09/01/21 0421 (!) 192/85 98.3 F (36.8 C) 72 16 98 %  09/01/21 0009 (!) 152/93 98.2 F (36.8 C) 72 16 97 %  08/31/21 1955 (!) 142/73 98.2 F (36.8 C) 66 20 97 %  Chest b/l air entry.  Clear to auscultation. Heart s1s2 Abdomen soft     left arm dressing          CNS non focal  Labs CBC Latest Ref Rng & Units 09/01/2021 08/31/2021 08/31/2021  WBC 4.0 - 10.5 K/uL 7.4 - 11.8(H)  Hemoglobin 13.0 - 17.0 g/dL 9.6(L) 9.9(L) 9.6(L)  Hematocrit 39.0 - 52.0 % 31.4(L) 32.4(L) 30.8(L)  Platelets 150 - 400 K/uL 130(L) - 140(L)     CMP Latest Ref Rng & Units 09/01/2021 08/31/2021 08/30/2021  Glucose 70 - 99 mg/dL 101(H) 97 102(H)  BUN 8 - 23 mg/dL 34(H) 40(H) 35(H)  Creatinine 0.61 - 1.24 mg/dL 1.87(H) 1.85(H) 2.05(H)  Sodium 135 - 145 mmol/L 138 134(L) 135  Potassium 3.5 - 5.1 mmol/L 3.7 3.7 3.7  Chloride 98 - 111 mmol/L 105 101 101  CO2 22 - 32 mmol/L 26 23 25   Calcium 8.9 - 10.3 mg/dL 8.3(L) 8.5(L) 8.6(L)  Total Protein 6.5 - 8.1 g/dL 6.0(L) 6.4(L) 6.3(L)  Total Bilirubin 0.3 - 1.2 mg/dL 0.4 0.7 1.0  Alkaline Phos 38 - 126 U/L 56 58 61  AST 15 - 41 U/L 35 37 44(H)  ALT 0 - 44 U/L 23 21 21      Micro 08/28/2021 blood culture group A streptococcus 08/31/2021 blood culture sent  Impression/recommendation Invasive group A streptococcus infection. Has bacteremia and left forearm necrotizing soft tissue infection.  Status post debridement Patient is on ceftriaxone and clindamycin The wound is looking better Pt wants  to go home- hopefully after tomorrow's ceftriaxone he could be discharged on PO amoxicillin 1 gram Q 12 hrs to start on 09/03/21 until 09/09/21   A. fib well-controlled on metoprolol and is also on Eliquis.  Neurocognitive impairment with some memory loss.  Discussed the management with the son in law at bed side- Will follow him as OP on 09/06/21 at [place]

## 2021-09-01 NOTE — Progress Notes (Signed)
°   08/31/21 2216  Notify: Provider  Provider Name/Title B. Randol Kern NP  Date Provider Notified 08/31/21  Time Provider Notified 2216  Notification Type  (amion)  Notification Reason Other (Comment) (blood noted in bathroom floor/toliet and pt legs at rectum)  Provider response See new orders  Date of Provider Response 08/31/21  Time of Provider Response 2221   Pt went to bathroom and when son checked on him found blood on floor and toliet. Moderate amount of blood. Pt noted to have hemorrhoids. Provider ordered H&H. Eliquis held until H&H came back stable. Provider ordered to give hs eliquis dose.

## 2021-09-01 NOTE — Plan of Care (Signed)
Pt remains confused and son at bedside. Pt had bm in floor this am. [place] Shark stool noted only blood noted when wiping.  Problem: Education: Goal: Knowledge of General Education information will improve Description: Including pain rating scale, medication(s)/side effects and non-pharmacologic comfort measures Outcome: Progressing   Problem: Health Behavior/Discharge Planning: Goal: Ability to manage health-related needs will improve Outcome: Progressing   Problem: Clinical Measurements: Goal: Ability to maintain clinical measurements within normal limits will improve Outcome: Progressing Goal: Will remain free from infection Outcome: Progressing Goal: Diagnostic test results will improve Outcome: Progressing Goal: Respiratory complications will improve Outcome: Progressing Goal: Cardiovascular complication will be avoided Outcome: Progressing   Problem: Activity: Goal: Risk for activity intolerance will decrease Outcome: Progressing   Problem: Nutrition: Goal: Adequate nutrition will be maintained Outcome: Progressing   Problem: Coping: Goal: Level of anxiety will decrease Outcome: Progressing   Problem: Elimination: Goal: Will not experience complications related to bowel motility Outcome: Progressing Goal: Will not experience complications related to urinary retention Outcome: Progressing   Problem: Pain Managment: Goal: General experience of comfort will improve Outcome: Progressing   Problem: Safety: Goal: Ability to remain free from injury will improve Outcome: Progressing   Problem: Skin Integrity: Goal: Risk for impaired skin integrity will decrease Outcome: Progressing

## 2021-09-01 NOTE — Progress Notes (Signed)
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 Rush Landmark 08/29/2021--defer further local WC with silvadene etc, and OP f/u Abx being adjusted by ID--final rec's forthcoming? BC x 2 repeat 08/31/2021 NGTD  Pain controlled on Norco 1 every 6 as needed GAS/+ Strep throat  GAS is +, blood culture + 1/31-treatment as above Paroxysmal A. 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 CC I/o cath x 1 on 08/30/21 start flomax 0.4, continue finasteride 5mg  Creatinine has stabilized ~ 1.8 Patient is passing good urine at this time Phosphorus replaced aggressively earlier in admission OSA on CPAP-noncompliant Will need titration and reassessment in the outpatient setting HTN From PTA isnt taking amlodipine 2.5 lisinopril  either Mild dementia   DVT prophylaxis: Eliquis Code Status: Full Family Communication: Discussed with patient's daughter-in-law [phone] (former nurse) on  phone Disposition:  Status is: Inpatient Remains inpatient appropriate because: Postop state   Consultants:  Infectious disease General surgery  Procedures: I&D 2/1  Antimicrobials: Clindamycin penicillin  Subjective:  Appears well Pain seems controlled No chest pain no fever Eating and drinking Voiding okay after in and out cath on 2/2  Objective: Vitals:   09/01/21 0421 09/01/21 0738 09/01/21 1232 09/01/21 1242  BP: (!) 192/85 (!) 168/88 (!) 168/122 (!) 150/86  Pulse: 72 61 75 (!) 58  Resp: 16 16 18    Temp: 98.3 F (36.8 C) 98.2 F (36.8 C) 97.9 F (36.6 C)   TempSrc:      SpO2: 98% 98% 100% 100%  Weight:      Height:        Intake/Output Summary (Last 24 hours) at 09/01/2021 1400 Last data filed at 09/01/2021 1300 Gross per 24 hour  Intake 730 ml  Output --  Net 730 ml    Filed Weights   08/28/21 1920  Weight: 79.4 kg    Examination:   no focal deficit thick neck Chest clear Abdomen obese nontender no rebound Neurologically is intact congruent power is 5/5, reflexes and sensory deferred Psych is euthymic      Data Reviewed: personally reviewed   CBC    Component Value Date/Time   WBC 7.4 09/01/2021 0328   RBC 3.76 (L) 09/01/2021 0328   HGB 9.6 (L) 09/01/2021 0328   HCT 31.4 (L) 09/01/2021 0328   PLT 130 (L) 09/01/2021 0328   MCV 83.5 09/01/2021 0328   MCH 25.5 (L) 09/01/2021 0328   MCHC 30.6 09/01/2021 0328   RDW 16.4 (H) 09/01/2021 0328   LYMPHSABS 0.7 09/01/2021 0328   MONOABS 0.5 09/01/2021 0328  EOSABS 0.2 09/01/2021 0328   BASOSABS 0.0 09/01/2021 0328   CMP Latest Ref Rng & Units 09/01/2021 08/31/2021 08/30/2021  Glucose 70 - 99 mg/dL 572(I) 97 203(T)  BUN 8 - 23 mg/dL 59(R) 41(U) 38(G)  Creatinine 0.61 - 1.24 mg/dL 5.36(I) 6.80(H) 2.12(Y)  Sodium 135 - 145 mmol/L 138 134(L) 135  Potassium 3.5 - 5.1 mmol/L 3.7 3.7 3.7  Chloride 98 - 111 mmol/L 105 101 101  CO2 22 - 32 mmol/L 26 23 25   Calcium 8.9 - 10.3 mg/dL 8.3(L) 8.5(L) 8.6(L)   Total Protein 6.5 - 8.1 g/dL 6.0(L) 6.4(L) 6.3(L)  Total Bilirubin 0.3 - 1.2 mg/dL 0.4 0.7 1.0  Alkaline Phos 38 - 126 U/L 56 58 61  AST 15 - 41 U/L 35 37 44(H)  ALT 0 - 44 U/L 23 21 21    Radiology Studies: No results found. Scheduled Meds:  amLODipine  5 mg Oral Daily   apixaban  2.5 mg Oral BID   finasteride  5 mg Oral Daily   melatonin  10 mg Oral QHS   metoprolol succinate  100 mg Oral Daily   pantoprazole  80 mg Oral Daily   pravastatin  40 mg Oral q1800   silver sulfADIAZINE   Topical Q8H   tamsulosin  0.4 mg Oral Daily   Continuous Infusions:  cefTRIAXone (ROCEPHIN)  IV 2 g (09/01/21 1127)   clindamycin (CLEOCIN) IV 900 mg (09/01/21 1337)     LOS: 3 days   Time spent: 30  10/30/21, MD [facility] To contact the attending provider between 7A-7P or the covering provider during after hours 7P-7A, please log into the web site [email or web address] and access using universal [place] password for that web site. If you do not have the password, please call the hospital operator.  09/01/2021, 2:00 PM

## 2021-09-02 DIAGNOSIS — N400 Enlarged prostate without lower urinary tract symptoms: Secondary | ICD-10-CM

## 2021-09-02 DIAGNOSIS — B950 Streptococcus, group A, as the cause of diseases classified elsewhere: Secondary | ICD-10-CM

## 2021-09-02 DIAGNOSIS — A409 Streptococcal sepsis, unspecified: Secondary | ICD-10-CM

## 2021-09-02 DIAGNOSIS — R6520 Severe sepsis without septic shock: Secondary | ICD-10-CM

## 2021-09-02 DIAGNOSIS — N179 Acute kidney failure, unspecified: Secondary | ICD-10-CM

## 2021-09-02 LAB — CULTURE, BLOOD (ROUTINE X 2): Culture: NO GROWTH

## 2021-09-02 LAB — CULTURE, GROUP A STREP (THRC)

## 2021-09-02 LAB — BASIC METABOLIC PANEL
Anion gap: 6 (ref 5–15)
BUN: 29 mg/dL — ABNORMAL HIGH (ref 8–23)
CO2: 26 mmol/L (ref 22–32)
Calcium: 8.7 mg/dL — ABNORMAL LOW (ref 8.9–10.3)
Chloride: 106 mmol/L (ref 98–111)
Creatinine, Ser: 1.72 mg/dL — ABNORMAL HIGH (ref 0.61–1.24)
GFR, Estimated: 39 mL/min — ABNORMAL LOW (ref >60)
Glucose, Bld: 102 mg/dL — ABNORMAL HIGH (ref 70–99)
Potassium: 4 mmol/L (ref 3.5–5.1)
Sodium: 138 mmol/L (ref 135–145)

## 2021-09-02 MED ORDER — AMOXICILLIN 500 MG PO CAPS: 500.0000 mg | ORAL_CAPSULE | Freq: Three times a day (TID) | ORAL | 0 refills | Status: DC

## 2021-09-02 MED ORDER — AMLODIPINE BESYLATE 5 MG PO TABS: 5.0000 mg | ORAL_TABLET | Freq: Every day | ORAL | 11 refills | Status: DC

## 2021-09-02 MED ORDER — TAMSULOSIN HCL 0.4 MG PO CAPS: 0.4000 mg | ORAL_CAPSULE | Freq: Every day | ORAL | 2 refills | Status: DC

## 2021-09-02 NOTE — TOC Transition Note (Addendum)
Transition of Care [facility]) - CM/SW Discharge Note   Patient Details  Name: Jose Baker MRN: [id number] Date of Birth: Feb 28, 1941  [facility] CM/SW Contact:  Gildardo Griffes, LCSW Phone Number: 09/02/2021, 10:23 AM   Clinical Narrative:     Patient to discharge home today, CSW notes iv antibiotics have changed to PO. Family supportive and able to care for wound at home per RN. Patient to follow up with outpatient wound clinic for any needs.    PCP is Maudie Flakes, MD.   Wife and daughter drive him to appointments.   Pharmacy is [facility] in [place]. No issues obtaining medications. No home health/DME use prior to admission. Family will transport him home at discharge. No discharge needs identified at this time.    Final next level of care: Home/Self Care Barriers to Discharge: No Barriers Identified   Patient Goals and CMS Choice Patient states their goals for this hospitalization and ongoing recovery are:: to go home CMS Medicare.gov Compare Post Acute Care list provided to:: Patient Choice offered to / list presented to : Patient  Discharge Placement                       Discharge Plan and Services     Post Acute Care Choice: NA                               Social Determinants of Health (SDOH) Interventions     Readmission Risk Interventions Readmission Risk Prevention Plan 08/30/2021  Transportation Screening Complete  PCP or Specialist Appt within 3-5 Days Complete  Social Work Consult for Recovery Care Planning/Counseling Complete  Palliative Care Screening Not Applicable  Medication Review Oceanographer) Complete  Some recent data might be hidden

## 2021-09-02 NOTE — Progress Notes (Signed)
Wound care education provided. Patient son demonstrated adequate wound dressing change, and is confident he will be able to complete at home.

## 2021-09-02 NOTE — Discharge Summary (Signed)
Pn21  Physician Discharge Summary   Patient: Jose Baker MRN: [id number] DOB: 1941/05/14  Admit date:     08/28/2021  Discharge date: 09/02/21  Discharge Physician: Nita Sells   PCP: Sofie Hartigan, MD   Recommendations at discharge:   Complete amoxicillin to complete the treatment for group A strep Needs wound review by primary care physician about 1 week Consider urodynamic studies-had retention this hospitalization-on Flomax Needs outpatient discussion CPAP-noncompliant-refer for split-night study Needs Chem-12 CBC 1 week   Discharge Diagnoses: Principal Problem:   Streptococcal sepsis, unspecified ([place]) Active Problems:   Arthritis   GERD (gastroesophageal reflux disease)   Hyperlipidemia   Hypertension   OSA (obstructive sleep apnea)   Paroxysmal atrial fibrillation (HCC)   Left arm swelling   Stage 3b chronic kidney disease (CKD) ([place])   Hypophosphatemia   Group A streptococcal infection  Resolved Problems:   * No resolved hospital problems. *   Hospital Course:  81 year old white male community dwelling Known paroxysmal A. 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 Ladonna Snide 08/29/2021--defer further local WC with silvadene etc, and OP f/u Dr. Tama High gave 1 final  dose of Rocephin on 2/5 and patient will complete amoxicillin from 2/6 onwards and then finish them off BC x 2 repeat 08/31/2021 NGTD  He does not have pain so he went home on Tylenol GAS/+ Strep throat  GAS is +, blood culture + 1/31-treatment as above Paroxysmal A. 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 CC I/o cath x 1 on 08/30/21 start flomax 0.4, continue finasteride 5mg -May need urodynamic studies in outpatient Creatinine has stabilized ~ 1.8 Patient is passing good urine at this time Phosphorus replaced aggressively earlier in admission OSA on CPAP-noncompliant Will need titration and reassessment in the outpatient setting--PCP to arrange HTN From PTA isnt taking amlodipine 2.5 lisinopril  either Resumed amlodipine at 5 am going home on Toprol-XL 100 daily Mild dementia Quite mild--continue meds in the outpatient setting  Assessment and Plan: No notes have been filed under this hospital service. Service: Hospitalist        Pain control - [employer] Controlled Substance Reporting System database was reviewed. and patient was instructed, not to drive, operate heavy machinery, perform activities at heights, swimming or participation in water activities or provide baby-sitting services while on Pain, Sleep and Anxiety Medications; until their outpatient Physician has advised to do so again. Also recommended to not to take more than prescribed Pain, Sleep and Anxiety Medications.   Consultants: General surgery, ID Procedures performed: Incision and drainage on 2/1 diet recommendation:  Discharge Diet Orders (From admission, onward)     Start     Ordered   09/02/21 0000  Diet - low sodium heart healthy        09/02/21 1013           Cardiac diet  DISCHARGE MEDICATION: Allergies as of 09/02/2021       Reactions    Lipitor [atorvastatin Calcium]         Medication List     STOP taking these medications    CALCIUM 500 + D PO   HYDROcodone-acetaminophen 5-325 MG tablet Commonly known as: Norco   lisinopril 5 MG tablet Commonly known as: ZESTRIL   ondansetron 4 MG disintegrating tablet Commonly known as: Zofran ODT   QUEtiapine 50 MG tablet Commonly known as: SEROQUEL   Selenium 200 MCG Caps   verapamil 180 MG CR tablet Commonly known as: CALAN-SR   Vitamin D3 50 MCG (2000 UT) Tabs       TAKE these medications    amLODipine 5 MG tablet Commonly known as: NORVASC Take 1 tablet (5 mg total) by mouth daily. Start taking on: September 03, 2021 What changed:  medication strength how much to take   amoxicillin 500 MG capsule Commonly known as: AMOXIL Take 1 capsule (500 mg total) by mouth 3 (three) times daily for 6 days. Start taking on: September 03, 2021   b complex vitamins tablet Take 1 tablet by mouth daily.   Eliquis 2.5 MG Tabs tablet Generic drug: apixaban Take 2.5 mg by mouth 2 (two) times daily.   febuxostat 40 MG tablet Commonly known as: ULORIC Take 40 mg by mouth daily.   finasteride 5 MG tablet Commonly known as: PROSCAR Take 1 tablet (5 mg total) by mouth daily.   fish oil-omega-3 fatty acids 1000 MG capsule Take 2 g by mouth daily.   melatonin 3 MG Tabs tablet Take 10 mg by mouth at bedtime.   metoprolol succinate 100 MG 24 hr tablet Commonly known as: TOPROL-XL Take 100 mg by mouth daily.   omeprazole 20 MG capsule Commonly known as: PRILOSEC 40 mg daily.   pravastatin 40 MG tablet Commonly known as: PRAVACHOL SMARTSIG:1 Tablet(s) By Mouth Every Evening   tamsulosin 0.4 MG Caps capsule Commonly known as: Flomax Take 1 capsule (0.4 mg total) by mouth daily after supper.               Discharge Care Instructions  (From admission, onward)           Start     Ordered   09/02/21 0000  Discharge wound care:       Comments:  Silvadene to UE Pack as u were shown by RN staff   09/02/21 1013          Pleasant awake coherent No pain Soft stool yesterday and today but no diarrhea no chest pain    Discharge Exam: Filed Weights   08/28/21 1920  Weight: 79.4 kg   EOMI NCAT no focal deficit thick neck Mallampati 4 Chest clear no rales rhonchi S1-S2 no murmur Wound not examined See pictures from prior days with pronounced improvement No lower extremity edema  Condition at discharge: good  The results of significant diagnostics from this hospitalization (including imaging, microbiology, ancillary and laboratory) are listed below for reference.   Imaging Studies: DG Chest 2 View  Result Date: 08/28/2021 CLINICAL DATA:  Altered mental status EXAM: CHEST - 2 VIEW COMPARISON:  Chest x-ray dated June 28, 2020 FINDINGS: Cardiac and mediastinal contours are unchanged. Large hiatal hernia. Bibasilar atelectasis. Lungs otherwise clear. Inferior costophrenic angles are excluded from  the field of view, no large pleural effusion. No evidence of pneumothorax. IMPRESSION: No active cardiopulmonary disease. Electronically Signed   By: Yetta Glassman M.D.   On: 08/28/2021 19:54   DG Forearm Left  Result Date: 08/28/2021 CLINICAL DATA:  Left forearm swelling, erythema EXAM: LEFT FOREARM - 2 VIEW COMPARISON:  None. FINDINGS: Frontal and lateral views of the left forearm are obtained. No acute or destructive bony lesions. Alignment of the left elbow and wrist is anatomic. Marked soft tissue swelling of the proximal forearm. No subcutaneous gas or radiopaque foreign body. IMPRESSION: 1. Proximal soft tissue swelling. 2. No acute bony abnormality. Electronically Signed   By: Randa Ngo M.D.   On: 08/28/2021 20:48   US Venous Img Upper Uni Left (DVT)  Result Date: 08/29/2021 CLINICAL DATA:  Left arm swelling EXAM: LEFT UPPER EXTREMITY VENOUS DOPPLER ULTRASOUND TECHNIQUE: Gray-scale sonography with graded compression, as  well as color Doppler and duplex ultrasound were performed to evaluate the upper extremity deep venous system from the level of the subclavian vein and including the jugular, axillary, basilic, radial, ulnar and upper cephalic vein. Spectral Doppler was utilized to evaluate flow at rest and with distal augmentation maneuvers. COMPARISON:  None. FINDINGS: Contralateral Subclavian Vein: Respiratory phasicity is normal and symmetric with the symptomatic side. No evidence of thrombus. Normal compressibility. Internal Jugular Vein: No evidence of thrombus. Normal compressibility, respiratory phasicity and response to augmentation. Subclavian Vein: No evidence of thrombus. Normal compressibility, respiratory phasicity and response to augmentation. Axillary Vein: No evidence of thrombus. Normal compressibility, respiratory phasicity and response to augmentation. Cephalic Vein: No evidence of thrombus. Normal compressibility, respiratory phasicity and response to augmentation. Basilic Vein: No evidence of thrombus. Normal compressibility, respiratory phasicity and response to augmentation. Brachial Veins: No evidence of thrombus. Normal compressibility, respiratory phasicity and response to augmentation. Radial Veins: No evidence of thrombus. Normal compressibility, respiratory phasicity and response to augmentation. Ulnar Veins: No evidence of thrombus. Normal compressibility, respiratory phasicity and response to augmentation. Venous Reflux:  None visualized. Other Findings:  None visualized. IMPRESSION: No evidence of DVT within the left upper extremity. Electronically Signed   By: Julian Hy M.D.   On: 08/29/2021 00:21   CT Extrem Up Entire Arm L W/CM  Result Date: 08/29/2021 CLINICAL DATA:  Left forearm swelling, no known injury, initial encounter EXAM: CT OF THE UPPER LEFT EXTREMITY WITH CONTRAST TECHNIQUE: Multidetector CT imaging of the upper left extremity was performed according to the standard protocol  following intravenous contrast administration. RADIATION DOSE REDUCTION: This exam was performed according to the departmental dose-optimization program which includes automated exposure control, adjustment of the mA and/or kV according to patient size and/or use of iterative reconstruction technique. CONTRAST:  74mL OMNIPAQUE IOHEXOL 350 MG/ML SOLN COMPARISON:  None. FINDINGS: Bones/Joint/Cartilage Bony structures show degenerative change of the left shoulder joint. No acute fracture or dislocation is noted. No bony erosive changes are noted. Ligaments Suboptimally assessed by CT. Muscles and Tendons Musculature is within normal limits. Soft tissues Considerable soft tissue swelling is noted along the lateral and posterior aspect of the elbow extending into the forearm. Some subcutaneous fluid collections suggestive of blisters are noted anteriorly and laterally. No discrete abscess is noted. IMPRESSION: Considerable subcutaneous edema is identified in the region the proximal and mid forearm as well as about the elbow similar to that seen on physical exam. Blistering is noted in the skin surface similar to that seen on physical exam. No underlying bony abnormality is seen. No drainable  abscess is seen. Electronically Signed   By: Inez Catalina M.D.   On: 08/29/2021 03:19    Microbiology: Results for orders placed or performed during the hospital encounter of 08/28/21  Resp Panel by RT-PCR (Flu A&B, Covid) Nasopharyngeal Swab     Status: None   Collection Time: 08/28/21  7:23 PM   Specimen: Nasopharyngeal Swab; Nasopharyngeal(NP) swabs in vial transport medium  Result Value Ref Range Status   SARS Coronavirus 2 by RT PCR NEGATIVE NEGATIVE Final    Comment: (NOTE) SARS-CoV-2 target nucleic acids are NOT DETECTED.  The SARS-CoV-2 RNA is generally detectable in upper respiratory specimens during the acute phase of infection. The lowest concentration of SARS-CoV-2 viral copies this assay can detect is 138  copies/mL. A negative result does not preclude SARS-Cov-2 infection and should not be used as the sole basis for treatment or other patient management decisions. A negative result may occur with  improper specimen collection/handling, submission of specimen other than nasopharyngeal swab, presence of viral mutation(s) within the areas targeted by this assay, and inadequate number of viral copies(<138 copies/mL). A negative result must be combined with clinical observations, patient history, and epidemiological information. The expected result is Negative.  Fact Sheet for Patients:  EntrepreneurPulse.com.au  Fact Sheet for Healthcare Providers:  IncredibleEmployment.be  This test is no t yet approved or cleared by the [place] FDA and  has been authorized for detection and/or diagnosis of SARS-CoV-2 by FDA under an Emergency Use Authorization (EUA). This EUA will remain  in effect (meaning this test can be used) for the duration of the COVID-19 declaration under Section 564(b)(1) of the Act, 21 U.S.C.section 360bbb-3(b)(1), unless the authorization is terminated  or revoked sooner.       Influenza A by PCR NEGATIVE NEGATIVE Final   Influenza B by PCR NEGATIVE NEGATIVE Final    Comment: (NOTE) The Xpert Xpress SARS-CoV-2/FLU/RSV plus assay is intended as an aid in the diagnosis of influenza from Nasopharyngeal swab specimens and should not be used as a sole basis for treatment. Nasal washings and aspirates are unacceptable for Xpert Xpress SARS-CoV-2/FLU/RSV testing.  Fact Sheet for Patients: EntrepreneurPulse.com.au  Fact Sheet for Healthcare Providers: IncredibleEmployment.be  This test is not yet approved or cleared by the [place] FDA and has been authorized for detection and/or diagnosis of SARS-CoV-2 by FDA under an Emergency Use Authorization (EUA). This EUA will remain in effect (meaning  this test can be used) for the duration of the COVID-19 declaration under Section 564(b)(1) of the Act, 21 U.S.C. section 360bbb-3(b)(1), unless the authorization is terminated or revoked.  Performed at [facility], [address] [place], [place] [address]   Blood culture (routine x 2)     Status: Abnormal   Collection Time: 08/28/21  7:24 PM   Specimen: BLOOD  Result Value Ref Range Status   Specimen Description   Final    BLOOD RIGHT ANTECUBITAL Performed at [facility], [address], [place], [place] [address]    Special Requests   Final    BOTTLES DRAWN AEROBIC AND ANAEROBIC BCHV Performed at [facility], [address] [place], [place] [address]    Culture  Setup Time   Final    GRAM POSITIVE COCCI IN BOTH AEROBIC AND ANAEROBIC BOTTLES CRITICAL RESULT CALLED TO, READ BACK BY AND VERIFIED WITH: Lu Duffel PHARMD [id number] 08/29/21 HNM Performed at [facility], [place] [address], [place], [place] [address]    Culture GROUP A  STREP (S.PYOGENES) ISOLATED (A)  Final   Report Status 08/31/2021 FINAL  Final   Organism ID, Bacteria GROUP A STREP (S.PYOGENES) ISOLATED  Final      Susceptibility   Group a strep (s.pyogenes) isolated - MIC*    PENICILLIN <=0.06 SENSITIVE Sensitive     CEFTRIAXONE <=0.12 SENSITIVE Sensitive     ERYTHROMYCIN <=0.12 SENSITIVE Sensitive     LEVOFLOXACIN 0.5 SENSITIVE Sensitive     VANCOMYCIN 0.5 SENSITIVE Sensitive     CLINDAMYCIN Value in next row Sensitive      SENSITIVE=0.25S    * GROUP A STREP (S.PYOGENES) ISOLATED  Blood Culture ID Panel (Reflexed)     Status: Abnormal   Collection Time: 08/28/21  7:24 PM  Result Value Ref Range Status   Enterococcus faecalis NOT DETECTED NOT DETECTED Final   Enterococcus Faecium NOT DETECTED NOT DETECTED Final   Listeria monocytogenes NOT DETECTED NOT DETECTED Final   Staphylococcus species NOT DETECTED NOT DETECTED Final   Staphylococcus aureus (BCID) NOT DETECTED NOT  DETECTED Final   Staphylococcus epidermidis NOT DETECTED NOT DETECTED Final   Staphylococcus lugdunensis NOT DETECTED NOT DETECTED Final   Streptococcus species DETECTED (A) NOT DETECTED Final    Comment: CRITICAL RESULT CALLED TO, READ BACK BY AND VERIFIED WITH: DEVAN MITCHELL PHARMD 0827 08/29/21 HNM    Streptococcus agalactiae NOT DETECTED NOT DETECTED Final   Streptococcus pneumoniae NOT DETECTED NOT DETECTED Final   Streptococcus pyogenes DETECTED (A) NOT DETECTED Final    Comment: CRITICAL RESULT CALLED TO, READ BACK BY AND VERIFIED WITH: Lu Duffel PHARMD 0827 08/29/21 HNM    A.calcoaceticus-baumannii NOT DETECTED NOT DETECTED Final   Bacteroides fragilis NOT DETECTED NOT DETECTED Final   Enterobacterales NOT DETECTED NOT DETECTED Final   Enterobacter cloacae complex NOT DETECTED NOT DETECTED Final   Escherichia coli NOT DETECTED NOT DETECTED Final   Klebsiella aerogenes NOT DETECTED NOT DETECTED Final   Klebsiella oxytoca NOT DETECTED NOT DETECTED Final   Klebsiella pneumoniae NOT DETECTED NOT DETECTED Final   Proteus species NOT DETECTED NOT DETECTED Final   Salmonella species NOT DETECTED NOT DETECTED Final   Serratia marcescens NOT DETECTED NOT DETECTED Final   Haemophilus influenzae NOT DETECTED NOT DETECTED Final   Neisseria meningitidis NOT DETECTED NOT DETECTED Final   Pseudomonas aeruginosa NOT DETECTED NOT DETECTED Final   Stenotrophomonas maltophilia NOT DETECTED NOT DETECTED Final   Candida albicans NOT DETECTED NOT DETECTED Final   Candida auris NOT DETECTED NOT DETECTED Final   Candida glabrata NOT DETECTED NOT DETECTED Final   Candida krusei NOT DETECTED NOT DETECTED Final   Candida parapsilosis NOT DETECTED NOT DETECTED Final   Candida tropicalis NOT DETECTED NOT DETECTED Final   Cryptococcus neoformans/gattii NOT DETECTED NOT DETECTED Final    Comment: Performed at [facility], [address] [place], [place] [address]  Blood culture (routine x  2)     Status: None   Collection Time: 08/28/21 10:08 PM   Specimen: BLOOD  Result Value Ref Range Status   Specimen Description BLOOD RIGHT HAND  Final   Special Requests   Final    BOTTLES DRAWN AEROBIC ONLY Blood Culture results may not be optimal due to an inadequate volume of blood received in culture bottles   Culture   Final    NO GROWTH 5 DAYS Performed at [facility], [address], [place], [place] [address]    Report Status 09/02/2021 FINAL  Final  Group A Strep by PCR  Status: Abnormal   Collection Time: 08/29/21 10:00 AM   Specimen: Throat; Sterile Swab  Result Value Ref Range Status   Group A Strep by PCR DETECTED (A) NOT DETECTED Final    Comment: Performed at [facility], [address] [place], [place] [address]  Culture, group A strep     Status: None (Preliminary result)   Collection Time: 08/30/21 10:03 AM   Specimen: Throat  Result Value Ref Range Status   Specimen Description   Final    THROAT Performed at [facility], [address] [place], [place] [address]    Special Requests   Final    NONE Performed at [facility], [address], [place], [place] [address]    Culture   Final    CULTURE REINCUBATED FOR BETTER GROWTH Performed at [facility], [place] [address] [place], [place] [address]    Report Status PENDING  Incomplete  CULTURE, BLOOD (ROUTINE X 2) w Reflex to ID Panel     Status: None (Preliminary result)   Collection Time: 08/31/21  8:12 AM   Specimen: BLOOD  Result Value Ref Range Status   Specimen Description BLOOD BLOOD RIGHT HAND  Final   Special Requests   Final    BOTTLES DRAWN AEROBIC ONLY Blood Culture adequate volume   Culture   Final    NO GROWTH 2 DAYS Performed at [facility], [address] [place], [place] [address]    Report Status PENDING  Incomplete  CULTURE, BLOOD (ROUTINE X 2) w Reflex to ID Panel     Status: None (Preliminary result)   Collection  Time: 08/31/21 11:26 AM   Specimen: BLOOD RIGHT HAND  Result Value Ref Range Status   Specimen Description BLOOD RIGHT HAND  Final   Special Requests   Final    BOTTLES DRAWN AEROBIC AND ANAEROBIC Blood Culture adequate volume   Culture   Final    NO GROWTH 2 DAYS Performed at [facility], [address] [place],  [address]    Report Status PENDING  Incomplete    Labs: CBC: Recent Labs  Lab 08/28/21 1924 08/29/21 0657 08/30/21 0638 08/31/21 0607 08/31/21 2230 09/01/21 0328  WBC 20.3* 20.9* 16.9* 11.8*  --  7.4  NEUTROABS 18.5*  --  15.0* 10.5*  --  6.0  HGB 12.3* 10.8* 10.5* 9.6* 9.9* 9.6*  HCT 41.8 35.6* 34.3* 30.8* 32.4* 31.4*  MCV 85.5 85.4 83.3 82.6  --  83.5  PLT 153 124* 137* 140*  --  [id number]   Basic Metabolic Panel: Recent Labs  Lab 08/28/21 2208 08/29/21 0657 08/30/21 0638 08/31/21 0607 09/01/21 0328 09/02/21 0537  NA  --  134* 135 134* 138 138  K  --  3.8 3.7 3.7 3.7 4.0  CL  --  101 101 101 105 106  CO2  --  23 25 23 26 26   GLUCOSE  --  121* 102* 97 101* 102*  BUN  --  26* 35* 40* 34* 29*  CREATININE  --  1.91* 2.05* 1.85* 1.87* 1.72*  CALCIUM  --  8.6* 8.6* 8.5* 8.3* 8.7*  PHOS 2.3* 3.5  --   --   --   --    Liver Function Tests: Recent Labs  Lab 08/28/21 1924 08/30/21 0638 08/31/21 0607 09/01/21 0328  AST 36 44* 37 35  ALT 21 21 21 23   ALKPHOS 78 61 58 56  BILITOT 1.3* 1.0 0.7 0.4  PROT 7.3  6.3* 6.4* 6.0*  ALBUMIN 4.1 3.1* 3.1* 2.6*   CBG: Recent Labs  Lab 08/29/21 0311  GLUCAP 101*    Discharge time spent: greater than 30 minutes.  Signed: Nita Sells, MD [facility] 09/02/2021

## 2021-09-05 LAB — CULTURE, BLOOD (ROUTINE X 2)
Culture: NO GROWTH
Culture: NO GROWTH
Special Requests: ADEQUATE
Special Requests: ADEQUATE

## 2021-09-06 ENCOUNTER — Ambulatory Visit: Payer: Medicare Other | Attending: Infectious Diseases | Admitting: Infectious Diseases

## 2021-09-06 ENCOUNTER — Other Ambulatory Visit
Admission: RE | Admit: 2021-09-06 | Discharge: 2021-09-06 | Disposition: A | Discharge status: Home / Self Care | Payer: Medicare Other | Source: Ambulatory Visit | Attending: Infectious Diseases | Admitting: Infectious Diseases

## 2021-09-06 ENCOUNTER — Other Ambulatory Visit: Payer: Self-pay

## 2021-09-06 ENCOUNTER — Encounter: Payer: Self-pay | Admitting: Infectious Diseases

## 2021-09-06 ENCOUNTER — Telehealth: Payer: Self-pay

## 2021-09-06 ENCOUNTER — Inpatient Hospital Stay: Payer: Medicare Other | Admitting: Infectious Diseases

## 2021-09-06 VITALS — BP 159/84 | HR 69 | Temp 98.4°F | Wt 209.0 lb

## 2021-09-06 DIAGNOSIS — I96 Gangrene, not elsewhere classified: Secondary | ICD-10-CM | POA: Insufficient documentation

## 2021-09-06 DIAGNOSIS — E785 Hyperlipidemia, unspecified: Secondary | ICD-10-CM

## 2021-09-06 DIAGNOSIS — L03114 Cellulitis of left upper limb: Secondary | ICD-10-CM | POA: Diagnosis not present

## 2021-09-06 DIAGNOSIS — G4730 Sleep apnea, unspecified: Secondary | ICD-10-CM | POA: Insufficient documentation

## 2021-09-06 DIAGNOSIS — I10 Essential (primary) hypertension: Secondary | ICD-10-CM | POA: Diagnosis not present

## 2021-09-06 DIAGNOSIS — E7800 Pure hypercholesterolemia, unspecified: Secondary | ICD-10-CM | POA: Diagnosis not present

## 2021-09-06 DIAGNOSIS — B950 Streptococcus, group A, as the cause of diseases classified elsewhere: Secondary | ICD-10-CM | POA: Diagnosis not present

## 2021-09-06 DIAGNOSIS — I4891 Unspecified atrial fibrillation: Secondary | ICD-10-CM | POA: Insufficient documentation

## 2021-09-06 DIAGNOSIS — Z8673 Personal history of transient ischemic attack (TIA), and cerebral infarction without residual deficits: Secondary | ICD-10-CM | POA: Diagnosis not present

## 2021-09-06 DIAGNOSIS — A491 Streptococcal infection, unspecified site: Secondary | ICD-10-CM

## 2021-09-06 DIAGNOSIS — Z7901 Long term (current) use of anticoagulants: Secondary | ICD-10-CM | POA: Diagnosis not present

## 2021-09-06 DIAGNOSIS — G3184 Mild cognitive impairment, so stated: Secondary | ICD-10-CM | POA: Diagnosis not present

## 2021-09-06 DIAGNOSIS — L03113 Cellulitis of right upper limb: Secondary | ICD-10-CM | POA: Diagnosis not present

## 2021-09-06 LAB — CBC WITH DIFFERENTIAL/PLATELET
Abs Immature Granulocytes: 0.12 10*3/uL — ABNORMAL HIGH (ref 0.00–0.07)
Basophils Absolute: 0 10*3/uL (ref 0.0–0.1)
Basophils Relative: 1 %
Eosinophils Absolute: 0.2 10*3/uL (ref 0.0–0.5)
Eosinophils Relative: 2 %
HCT: 37.6 % — ABNORMAL LOW (ref 39.0–52.0)
Hemoglobin: 11.3 g/dL — ABNORMAL LOW (ref 13.0–17.0)
Immature Granulocytes: 1 %
Lymphocytes Relative: 13 %
Lymphs Abs: 1.1 10*3/uL (ref 0.7–4.0)
MCH: 25.3 pg — ABNORMAL LOW (ref 26.0–34.0)
MCHC: 30.1 g/dL (ref 30.0–36.0)
MCV: 84.1 fL (ref 80.0–100.0)
Monocytes Absolute: 0.5 10*3/uL (ref 0.1–1.0)
Monocytes Relative: 5 %
Neutro Abs: 6.6 10*3/uL (ref 1.7–7.7)
Neutrophils Relative %: 78 %
Platelets: 208 10*3/uL (ref 150–400)
RBC: 4.47 MIL/uL (ref 4.22–5.81)
RDW: 16.7 % — ABNORMAL HIGH (ref 11.5–15.5)
WBC: 8.5 10*3/uL (ref 4.0–10.5)
nRBC: 0 % (ref 0.0–0.2)

## 2021-09-06 LAB — COMPREHENSIVE METABOLIC PANEL
ALT: 21 U/L (ref 0–44)
AST: 28 U/L (ref 15–41)
Albumin: 3.3 g/dL — ABNORMAL LOW (ref 3.5–5.0)
Alkaline Phosphatase: 65 U/L (ref 38–126)
Anion gap: 7 (ref 5–15)
BUN: 15 mg/dL (ref 8–23)
CO2: 27 mmol/L (ref 22–32)
Calcium: 8.8 mg/dL — ABNORMAL LOW (ref 8.9–10.3)
Chloride: 101 mmol/L (ref 98–111)
Creatinine, Ser: 1.5 mg/dL — ABNORMAL HIGH (ref 0.61–1.24)
GFR, Estimated: 46 mL/min — ABNORMAL LOW (ref >60)
Glucose, Bld: 97 mg/dL (ref 70–99)
Potassium: 4.4 mmol/L (ref 3.5–5.1)
Sodium: 135 mmol/L (ref 135–145)
Total Bilirubin: 0.7 mg/dL (ref 0.3–1.2)
Total Protein: 7.1 g/dL (ref 6.5–8.1)

## 2021-09-06 MED ORDER — AMOXICILLIN 500 MG PO CAPS: 1000.0000 mg | ORAL_CAPSULE | Freq: Two times a day (BID) | ORAL | 0 refills | Status: DC

## 2021-09-06 NOTE — Progress Notes (Signed)
NAME: Jose Baker  DOB: 1940/11/20  MRN: [id number]  Date/Time: 09/06/2021 9:56 AM  Follow up visit after recent discharge Here with daughter and wife  ? Jose Baker is a 81 y.o. with a history of A-fib, CVA, hypertension, hypercholesterolemia, sleep apnea  08/28/21-09/02/21 for streptococcus pyogenes bacteremia and left arm cellulitis with superficial necrosis  and underwent superficial debridement and was discharged on PO amoxicillin after getting 6 days of Iv antibiotics presents for follow up Doing well No fever Swelling of arm resolved Wife doing the dressing  Past Medical History:  Diagnosis Date   Arrhythmia    Atrial fibrillation (HCC)    CVA (cerebral vascular accident) (HCC)    GERD without esophagitis    HTN (hypertension)    Hypercholesteremia    Obesity    Sleep apnea     Past Surgical History:  Procedure Laterality Date   BRAIN SURGERY     CATARACT EXTRACTION     INCISION AND DRAINAGE ABSCESS Left 08/29/2021   Procedure: INCISION AND DRAINAGE ABSCESS;  Surgeon: Campbell Lerner, MD;  Location: [facility] ORS;  Service: General;  Laterality: Left;  Pt not NPO, but emergency status precludes NPO status.    Social History   Socioeconomic History   Marital status: Unknown    Spouse name: Not on file   Number of children: Not on file   Years of education: Not on file   Highest education level: Not on file  Occupational History   Occupation: dairy farmer  Tobacco Use   Smoking status: Never   Smokeless tobacco: Never  Substance and Sexual Activity   Alcohol use: No   Drug use: No   Sexual activity: Not on file  Other Topics Concern   Not on file  Social History Narrative   Not on file   Social Determinants of Health   Financial Resource Strain: Not on file  Food Insecurity: Not on file  Transportation Needs: Not on file  Physical Activity: Not on file  Stress: Not on file  Social Connections: Not on file  Intimate Partner Violence: Not on file    Family  History  Problem Relation Age of Onset   Alzheimer's disease Other    Hypertension Other    Prostate cancer Neg Hx    Bladder Cancer Neg Hx    Kidney cancer Neg Hx    Allergies  Allergen Reactions   Lipitor [Atorvastatin Calcium]    I? Current Outpatient Medications  Medication Sig Dispense Refill   amLODipine (NORVASC) 5 MG tablet Take 1 tablet (5 mg total) by mouth daily. 30 tablet 11   amoxicillin (AMOXIL) 500 MG capsule Take 1 capsule (500 mg total) by mouth 3 (three) times daily for 6 days. 18 capsule 0   b complex vitamins tablet Take 1 tablet by mouth daily.     ELIQUIS 2.5 MG TABS tablet Take 2.5 mg by mouth 2 (two) times daily.     febuxostat (ULORIC) 40 MG tablet Take 40 mg by mouth daily.     finasteride (PROSCAR) 5 MG tablet Take 1 tablet (5 mg total) by mouth daily. 30 tablet 11   fish oil-omega-3 fatty acids 1000 MG capsule Take 2 g by mouth daily.     Melatonin 3 MG TABS Take 10 mg by mouth at bedtime.     metoprolol succinate (TOPROL-XL) 100 MG 24 hr tablet Take 100 mg by mouth daily.  1   omeprazole (PRILOSEC) 20 MG capsule 40 mg daily.  pravastatin (PRAVACHOL) 40 MG tablet SMARTSIG:1 Tablet(s) By Mouth Every Evening     tamsulosin (FLOMAX) 0.4 MG CAPS capsule Take 1 capsule (0.4 mg total) by mouth daily after supper. 30 capsule 2   No current facility-administered medications for this visit.     Abtx:  Anti-infectives (From admission, onward)    None       REVIEW OF SYSTEMS:  Const: negative fever, negative chills, negative weight loss Eyes: negative diplopia or visual changes, negative eye pain ENT: negative coryza, negative sore throat Resp: negative cough, hemoptysis, dyspnea Cards: negative for chest pain, palpitations, lower extremity edema GU: negative for frequency, dysuria and hematuria GI: Negative for abdominal pain, diarrhea, bleeding, constipation Heme: negative for easy bruising and gum/nose bleeding MS: negative for myalgias,  arthralgias, back pain and muscle weakness Neurolo memory problems  Psych: negative for feelings of anxiety, depression  Endocrine: negative for thyroid, diabetes Allergy/Immunology- as above ? Objective:  VITALS:  BP (!) 159/84    Pulse 69    Temp 98.4 F (36.9 C) (Oral)    Wt 209 lb (94.8 kg)    SpO2 94%    BMI 32.73 kg/m  PHYSICAL EXAM:  General: Alert, cooperative, no distress, appears stated age.  Head: Normocephalic, without obvious abnormality, atraumatic. Eyes: Conjunctivae clear, anicteric sclerae. Pupils are equal ENT Nares normal. No drainage or sinus tenderness. Lips, mucosa, and tongue normal. No Thrush Neck: Supple, symmetrical, no adenopathy, thyroid: non tender no carotid bruit and no JVD. Back: No CVA tenderness. Lungs: Clear to auscultation bilaterally. No Wheezing or Rhonchi. No rales. Heart: irregular, well controlled. Abdomen: did not examine Extremities: left arm dressing removed 09/06/21     09/01/21   09/01/21   Skin: No rashes or lesions. Or bruising Lymph: Cervical, supraclavicular normal. Neurologic: Grossly non-focal Pertinent Labs Lab Results CBC    Component Value Date/Time   WBC 7.4 09/01/2021 0328   RBC 3.76 (L) 09/01/2021 0328   HGB 9.6 (L) 09/01/2021 0328   HCT 31.4 (L) 09/01/2021 0328   PLT 130 (L) 09/01/2021 0328   MCV 83.5 09/01/2021 0328   MCH 25.5 (L) 09/01/2021 0328   MCHC 30.6 09/01/2021 0328   RDW 16.4 (H) 09/01/2021 0328   LYMPHSABS 0.7 09/01/2021 0328   MONOABS 0.5 09/01/2021 0328   EOSABS 0.2 09/01/2021 0328   BASOSABS 0.0 09/01/2021 0328    CMP Latest Ref Rng & Units 09/02/2021 09/01/2021 08/31/2021  Glucose 70 - 99 mg/dL 102(H) 101(H) 97  BUN 8 - 23 mg/dL 29(H) 34(H) 40(H)  Creatinine 0.61 - 1.24 mg/dL 1.72(H) 1.87(H) 1.85(H)  Sodium 135 - 145 mmol/L 138 138 134(L)  Potassium 3.5 - 5.1 mmol/L 4.0 3.7 3.7  Chloride 98 - 111 mmol/L 106 105 101  CO2 22 - 32 mmol/L 26 26 23   Calcium 8.9 - 10.3 mg/dL 8.7(L) 8.3(L) 8.5(L)   Total Protein 6.5 - 8.1 g/dL - 6.0(L) 6.4(L)  Total Bilirubin 0.3 - 1.2 mg/dL - 0.4 0.7  Alkaline Phos 38 - 126 U/L - 56 58  AST 15 - 41 U/L - 35 37  ALT 0 - 44 U/L - 23 21    Micro 08/28/2021 blood culture group A streptococcus 08/31/2021 NG  Impression/Recommendation Invasive group A streptococcus infection with bacteremia and left forearm necrotizing cellulitis.  Status postdebridement on 08/29/2021.  Infection much better. Initially treated with IV penicillin clindamycin followed by ceftriaxone and clindamycin.  After 6 days of IV antibiotics he was switched to p.o. amoxicillin on discharge.  The  plan was to give him 1 g every 12 but it was not enough for 500 every 8.  Daughter feels that every 12 dose will be better than cure because of compliance.  So we will change to every 12 for 7 days. Patient may need superficial debridement or a different management for the slough.  Patient will follow up with Dr. Christian Mate.  A-fib well-controlled on metoprolol and is also on Eliquis  Hyperlipidemia on Pravachol  Mild neurocognitive impairment.  Discussed management with patient, daughter and his wife.  ? Note:  This document was prepared using Dragon voice recognition software and may include unintentional dictation errors.

## 2021-09-06 NOTE — Patient Instructions (Addendum)
You are here for follow up of the left arm infection- you are on amoxicillin 500mg  TID- will increase to 1 gram Q12- will do labs today- need to follow up with Dr.Rodenberg [phone]   [address]   [address]   [place]

## 2021-09-06 NOTE — Telephone Encounter (Signed)
Patient daughter Boyd Kerbs aware of results and voiced her understanding.   Avyn Coate Lesli Albee, CMA   Lynn Ito, MD  P [facility] Triage Nurse Pool Please let the daughter know that his labs are stable and kidney function better than before. WBC N. thx

## 2021-09-11 ENCOUNTER — Other Ambulatory Visit: Payer: Self-pay

## 2021-09-11 ENCOUNTER — Encounter: Payer: Self-pay | Admitting: Surgery

## 2021-09-11 ENCOUNTER — Ambulatory Visit (INDEPENDENT_AMBULATORY_CARE_PROVIDER_SITE_OTHER): Payer: Medicare Other | Admitting: Surgery

## 2021-09-11 VITALS — Temp 98.3°F | Ht 62.0 in | Wt 205.0 lb

## 2021-09-11 DIAGNOSIS — B950 Streptococcus, group A, as the cause of diseases classified elsewhere: Secondary | ICD-10-CM

## 2021-09-11 DIAGNOSIS — L03114 Cellulitis of left upper limb: Secondary | ICD-10-CM | POA: Diagnosis not present

## 2021-09-11 MED ORDER — SILVER SULFADIAZINE 1 % EX CREA: TOPICAL_CREAM | CUTANEOUS | 1 refills | Status: AC

## 2021-09-11 NOTE — Progress Notes (Signed)
[facility] POST-OP OFFICE VISIT  09/11/2021  HPI: Jose Baker is a 81 y.o. male 13 days s/p debridement of a strep a cellulitis of the left forearm.  He has been utilizing Silvadene dressing changes on a daily basis.  Not actually providing any mechanical cleansing during showering.  They have been utilizing saline to wash it, with just irrigation.  Overall the wound has made significant progress.  There is a fair amount of well-healed epidermis, some areas of dried exudate/crusting.  No areas that appear angry or cellulitic.  There are limited area of moist exudate that I believe more mechanical attention while showering will help alleviate that process.  Significant improvement present.  He denies pain and tenderness.  Vital signs: Temp 98.3 F (36.8 C)    Ht 5\' 2"  (1.575 m)    Wt 205 lb (93 kg)    SpO2 92%    BMI 37.49 kg/m    Physical Exam: Constitutional: He appears well  Skin: As described above in HPI.  Significant improvement of the left forearm cellulitic changes with epidermal loss from strep a bleb formation.  Assessment/Plan: This is a 81 y.o. male 13 days s/p mechanical deep blebbing/debridement of the partial-thickness left forearm strep a cellulitis.  Patient Active Problem List   Diagnosis Date Noted   Group A streptococcal infection 09/02/2021   Streptococcal sepsis, unspecified ([place]) 09/02/2021   BPH (benign prostatic hyperplasia) 09/02/2021   Hypophosphatemia 08/29/2021   Left arm swelling 08/28/2021   Stage 3b chronic kidney disease (CKD) ([place]) 08/28/2021   Nonrheumatic aortic valve insufficiency 09/01/2019   Benign prostatic hyperplasia without lower urinary tract symptoms 12/04/2017   CKD (chronic kidney disease) stage 3, GFR 30-59 ml/min (HCC) 12/04/2017   Paroxysmal atrial fibrillation ([place]) 08/13/2016   Arthritis 08/12/2016   OSA (obstructive sleep apnea) 08/12/2016   Cerebral aneurysm without rupture 04/26/2016   GERD (gastroesophageal  reflux disease) 03/07/2014   Hyperlipidemia 03/07/2014   Hypertension 03/07/2014    -Advised utilization of little more mechanical cleansing in the shower, continue Silvadene for now for the raw areas.  We will see him back again in 9 days.  We will also have his grandson return with him he has a small focus of 3 mm wide either eschar or crust over the lateral malleolus of the ankle.  Apply Silvadene there as well and have him follow-up with same visit.   Ronny Bacon M.D., FACS 09/11/2021, 9:54 AM

## 2021-09-11 NOTE — Patient Instructions (Addendum)
Change the dressing daily. You may shower and cleanse the area. Apply silvadene cream and redress.  Follow up here in 9 days.   Refill on Silvadene has been sent in to your pharmacy.

## 2021-09-20 ENCOUNTER — Encounter: Payer: Self-pay | Admitting: Surgery

## 2021-09-20 ENCOUNTER — Other Ambulatory Visit: Payer: Self-pay

## 2021-09-20 ENCOUNTER — Ambulatory Visit: Payer: Medicare Other | Admitting: Infectious Diseases

## 2021-09-20 ENCOUNTER — Ambulatory Visit (INDEPENDENT_AMBULATORY_CARE_PROVIDER_SITE_OTHER): Payer: Medicare Other | Admitting: Surgery

## 2021-09-20 VITALS — BP 140/75 | HR 54 | Temp 97.9°F | Ht 62.0 in | Wt 202.6 lb

## 2021-09-20 DIAGNOSIS — L03114 Cellulitis of left upper limb: Secondary | ICD-10-CM | POA: Diagnosis not present

## 2021-09-20 DIAGNOSIS — B950 Streptococcus, group A, as the cause of diseases classified elsewhere: Secondary | ICD-10-CM

## 2021-09-20 NOTE — Progress Notes (Signed)
[facility] POST-OP OFFICE VISIT  09/20/2021  HPI: Jose Baker is a 81 y.o. male 22 days s/p partial-thickness skin debridement for streptococcal scalded skin syndrome cellulitis of the left forearm.  Patient is not having no drainage, no pain.  Denies any fevers or chills.  Presents today without a dressing in place.  He does have 1 small area which looks more like adherent dry flaky skin rather than crust.  So I believe he is completely healed and we now have to be concerned about protecting him from harm by ultraviolet light, etc.  Vital signs: BP 140/75    Pulse (!) 54    Temp 97.9 F (36.6 C) (Oral)    Ht 5\' 2"  (1.575 m)    Wt 202 lb 9.6 oz (91.9 kg)    SpO2 91%    BMI 37.06 kg/m    Physical Exam: Constitutional: He appears well.  At baseline.  Skin: Left forearm skin is fully epidermal lysed there is just one area where there is some adherent dry flaking skin that I do not believe is a crust.  There is no drainage, no erythema.  All the new skin is nice and pink however and reminding US of the vulnerability of it from the sun's rays.  Assessment/Plan: This is a 81 y.o. male 22 days s/p excision of labs from left forearm.  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  I advised utilizing some simple moisturizing agents either Crisco, petroleum jelly or Aquaphor type of skin moisturizer.  We discussed sunblock for exposure versus protection with clothing. I believe clothing is better.  I do anticipate a need for additional follow-up, we will be glad to see this pleasant gentleman and his family as needed.   Ronny Bacon M.D., FACS 09/20/2021, 11:03 AM

## 2021-09-20 NOTE — Patient Instructions (Signed)
If you have any concerns or questions, please feel free to call our office. Follow up as needed.   Avoid direct sunlight on wound. Wear a long sleeve on that arm while in sunlight. You may use moisturizer as needed.

## 2023-01-11 ENCOUNTER — Inpatient Hospital Stay (HOSPITAL_COMMUNITY): Payer: Medicare HMO | Admitting: Certified Registered Nurse Anesthetist

## 2023-01-11 ENCOUNTER — Encounter (HOSPITAL_COMMUNITY)
Admission: EM | Disposition: A | Discharge status: Home / Self Care | Payer: Self-pay | Source: Other Acute Inpatient Hospital | Attending: Vascular Surgery

## 2023-01-11 ENCOUNTER — Other Ambulatory Visit: Payer: Self-pay

## 2023-01-11 ENCOUNTER — Inpatient Hospital Stay (HOSPITAL_COMMUNITY): Payer: Medicare HMO

## 2023-01-11 ENCOUNTER — Emergency Department
Admission: EM | Admit: 2023-01-11 | Discharge: 2023-01-11 | Disposition: A | Discharge status: Other Acute Inpatient Hospital | Payer: Medicare HMO | Attending: Emergency Medicine | Admitting: Emergency Medicine

## 2023-01-11 ENCOUNTER — Inpatient Hospital Stay (HOSPITAL_COMMUNITY)
Admission: EM | Admit: 2023-01-11 | Discharge: 2023-01-14 | DRG: 271 | Disposition: A | Discharge status: Home / Self Care | Payer: Medicare HMO | Source: Other Acute Inpatient Hospital | Attending: Vascular Surgery | Admitting: Vascular Surgery

## 2023-01-11 ENCOUNTER — Emergency Department: Payer: Medicare HMO

## 2023-01-11 DIAGNOSIS — R58 Hemorrhage, not elsewhere classified: Secondary | ICD-10-CM | POA: Diagnosis not present

## 2023-01-11 DIAGNOSIS — Z9889 Other specified postprocedural states: Secondary | ICD-10-CM

## 2023-01-11 DIAGNOSIS — G4730 Sleep apnea, unspecified: Secondary | ICD-10-CM

## 2023-01-11 DIAGNOSIS — R1032 Left lower quadrant pain: Secondary | ICD-10-CM | POA: Diagnosis not present

## 2023-01-11 DIAGNOSIS — F0390 Unspecified dementia without behavioral disturbance: Secondary | ICD-10-CM | POA: Diagnosis present

## 2023-01-11 DIAGNOSIS — Z8249 Family history of ischemic heart disease and other diseases of the circulatory system: Secondary | ICD-10-CM | POA: Diagnosis not present

## 2023-01-11 DIAGNOSIS — Z8673 Personal history of transient ischemic attack (TIA), and cerebral infarction without residual deficits: Secondary | ICD-10-CM | POA: Diagnosis not present

## 2023-01-11 DIAGNOSIS — I10 Essential (primary) hypertension: Secondary | ICD-10-CM | POA: Diagnosis present

## 2023-01-11 DIAGNOSIS — I723 Aneurysm of iliac artery: Secondary | ICD-10-CM | POA: Diagnosis not present

## 2023-01-11 DIAGNOSIS — F03B0 Unspecified dementia, moderate, without behavioral disturbance, psychotic disturbance, mood disturbance, and anxiety: Secondary | ICD-10-CM | POA: Insufficient documentation

## 2023-01-11 DIAGNOSIS — Z7901 Long term (current) use of anticoagulants: Secondary | ICD-10-CM

## 2023-01-11 DIAGNOSIS — Z743 Need for continuous supervision: Secondary | ICD-10-CM | POA: Diagnosis not present

## 2023-01-11 DIAGNOSIS — Z7982 Long term (current) use of aspirin: Secondary | ICD-10-CM

## 2023-01-11 DIAGNOSIS — I4891 Unspecified atrial fibrillation: Secondary | ICD-10-CM | POA: Diagnosis not present

## 2023-01-11 DIAGNOSIS — Z79899 Other long term (current) drug therapy: Secondary | ICD-10-CM | POA: Diagnosis not present

## 2023-01-11 DIAGNOSIS — E669 Obesity, unspecified: Secondary | ICD-10-CM | POA: Diagnosis not present

## 2023-01-11 DIAGNOSIS — E7800 Pure hypercholesterolemia, unspecified: Secondary | ICD-10-CM | POA: Diagnosis not present

## 2023-01-11 DIAGNOSIS — Z888 Allergy status to other drugs, medicaments and biological substances status: Secondary | ICD-10-CM

## 2023-01-11 DIAGNOSIS — I739 Peripheral vascular disease, unspecified: Secondary | ICD-10-CM | POA: Diagnosis not present

## 2023-01-11 DIAGNOSIS — K219 Gastro-esophageal reflux disease without esophagitis: Secondary | ICD-10-CM | POA: Diagnosis not present

## 2023-01-11 DIAGNOSIS — F05 Delirium due to known physiological condition: Secondary | ICD-10-CM | POA: Diagnosis not present

## 2023-01-11 DIAGNOSIS — I480 Paroxysmal atrial fibrillation: Secondary | ICD-10-CM | POA: Insufficient documentation

## 2023-01-11 DIAGNOSIS — N492 Inflammatory disorders of scrotum: Secondary | ICD-10-CM | POA: Diagnosis not present

## 2023-01-11 DIAGNOSIS — M545 Low back pain, unspecified: Secondary | ICD-10-CM | POA: Diagnosis not present

## 2023-01-11 DIAGNOSIS — Z820 Family history of epilepsy and other diseases of the nervous system: Secondary | ICD-10-CM | POA: Diagnosis not present

## 2023-01-11 HISTORY — PX: ABDOMINAL AORTIC ENDOVASCULAR STENT GRAFT: SHX5707

## 2023-01-11 HISTORY — PX: PERCUTANEOUS VENOUS THROMBECTOMY,LYSIS WITH INTRAVASCULAR ULTRASOUND (IVUS): SHX6751

## 2023-01-11 HISTORY — PX: ANEURYSM COILING: SHX5349

## 2023-01-11 HISTORY — DX: Unspecified dementia, unspecified severity, without behavioral disturbance, psychotic disturbance, mood disturbance, and anxiety: F03.90

## 2023-01-11 HISTORY — PX: ULTRASOUND GUIDANCE FOR VASCULAR ACCESS: SHX6516

## 2023-01-11 LAB — TYPE AND SCREEN: Unit division: 0

## 2023-01-11 LAB — COMPREHENSIVE METABOLIC PANEL
ALT: 9 U/L (ref 0–44)
AST: 21 U/L (ref 15–41)
Albumin: 3.6 g/dL (ref 3.5–5.0)
Alkaline Phosphatase: 60 U/L (ref 38–126)
Anion gap: 12 (ref 5–15)
BUN: 33 mg/dL — ABNORMAL HIGH (ref 8–23)
CO2: 22 mmol/L (ref 22–32)
Calcium: 8.7 mg/dL — ABNORMAL LOW (ref 8.9–10.3)
Chloride: 105 mmol/L (ref 98–111)
Creatinine, Ser: 2.07 mg/dL — ABNORMAL HIGH (ref 0.61–1.24)
GFR, Estimated: 31 mL/min — ABNORMAL LOW (ref >60)
Glucose, Bld: 155 mg/dL — ABNORMAL HIGH (ref 70–99)
Potassium: 4 mmol/L (ref 3.5–5.1)
Sodium: 139 mmol/L (ref 135–145)
Total Bilirubin: 1.9 mg/dL — ABNORMAL HIGH (ref 0.3–1.2)
Total Protein: 6.8 g/dL (ref 6.5–8.1)

## 2023-01-11 LAB — POCT I-STAT 7, (LYTES, BLD GAS, ICA,H+H)
Acid-base deficit: 2 mmol/L (ref 0.0–2.0)
Acid-base deficit: 3 mmol/L — ABNORMAL HIGH (ref 0.0–2.0)
Acid-base deficit: 3 mmol/L — ABNORMAL HIGH (ref 0.0–2.0)
Bicarbonate: 22.2 mmol/L (ref 20.0–28.0)
Bicarbonate: 24.2 mmol/L (ref 20.0–28.0)
Bicarbonate: 23.7 mmol/L (ref 20.0–28.0)
Calcium, Ion: 1.16 mmol/L (ref 1.15–1.40)
Calcium, Ion: 1.22 mmol/L (ref 1.15–1.40)
Calcium, Ion: 1.11 mmol/L — ABNORMAL LOW (ref 1.15–1.40)
HCT: 21 % — ABNORMAL LOW (ref 39.0–52.0)
HCT: 24 % — ABNORMAL LOW (ref 39.0–52.0)
HCT: 25 % — ABNORMAL LOW (ref 39.0–52.0)
Hemoglobin: 7.1 g/dL — ABNORMAL LOW (ref 13.0–17.0)
Hemoglobin: 8.2 g/dL — ABNORMAL LOW (ref 13.0–17.0)
Hemoglobin: 8.5 g/dL — ABNORMAL LOW (ref 13.0–17.0)
O2 Saturation: 100 %
O2 Saturation: 85 %
O2 Saturation: 100 %
Patient temperature: 36.5
Potassium: 4.3 mmol/L (ref 3.5–5.1)
Potassium: 4.3 mmol/L (ref 3.5–5.1)
Potassium: 4.7 mmol/L (ref 3.5–5.1)
Sodium: 139 mmol/L (ref 135–145)
Sodium: 140 mmol/L (ref 135–145)
Sodium: 139 mmol/L (ref 135–145)
TCO2: 23 mmol/L (ref 22–32)
TCO2: 26 mmol/L (ref 22–32)
TCO2: 25 mmol/L (ref 22–32)
pCO2 arterial: 38.8 mmHg (ref 32–48)
pCO2 arterial: 49.3 mmHg — ABNORMAL HIGH (ref 32–48)
pCO2 arterial: 48.2 mmHg — ABNORMAL HIGH (ref 32–48)
pH, Arterial: 7.3 — ABNORMAL LOW (ref 7.35–7.45)
pH, Arterial: 7.362 (ref 7.35–7.45)
pH, Arterial: 7.3 — ABNORMAL LOW (ref 7.35–7.45)
pO2, Arterial: 322 mmHg — ABNORMAL HIGH (ref 83–108)
pO2, Arterial: 56 mmHg — ABNORMAL LOW (ref 83–108)
pO2, Arterial: 307 mmHg — ABNORMAL HIGH (ref 83–108)

## 2023-01-11 LAB — CBC
HCT: 30.3 % — ABNORMAL LOW (ref 39.0–52.0)
HCT: 27.7 % — ABNORMAL LOW (ref 39.0–52.0)
Hemoglobin: 9 g/dL — ABNORMAL LOW (ref 13.0–17.0)
Hemoglobin: 8.6 g/dL — ABNORMAL LOW (ref 13.0–17.0)
MCH: 26.3 pg (ref 26.0–34.0)
MCH: 26.3 pg (ref 26.0–34.0)
MCHC: 29.7 g/dL — ABNORMAL LOW (ref 30.0–36.0)
MCHC: 31 g/dL (ref 30.0–36.0)
MCV: 88.6 fL (ref 80.0–100.0)
MCV: 84.7 fL (ref 80.0–100.0)
Platelets: 204 10*3/uL (ref 150–400)
Platelets: 129 10*3/uL — ABNORMAL LOW (ref 150–400)
RBC: 3.42 MIL/uL — ABNORMAL LOW (ref 4.22–5.81)
RBC: 3.27 MIL/uL — ABNORMAL LOW (ref 4.22–5.81)
RDW: 15.6 % — ABNORMAL HIGH (ref 11.5–15.5)
RDW: 15.9 % — ABNORMAL HIGH (ref 11.5–15.5)
WBC: 10.9 10*3/uL — ABNORMAL HIGH (ref 4.0–10.5)
WBC: 10.5 10*3/uL (ref 4.0–10.5)
nRBC: 0 % (ref 0.0–0.2)
nRBC: 0 % (ref 0.0–0.2)

## 2023-01-11 LAB — APTT: aPTT: 68 seconds — ABNORMAL HIGH (ref 24–36)

## 2023-01-11 LAB — BASIC METABOLIC PANEL
Anion gap: 10 (ref 5–15)
BUN: 29 mg/dL — ABNORMAL HIGH (ref 8–23)
CO2: 21 mmol/L — ABNORMAL LOW (ref 22–32)
Calcium: 7.7 mg/dL — ABNORMAL LOW (ref 8.9–10.3)
Chloride: 107 mmol/L (ref 98–111)
Creatinine, Ser: 1.89 mg/dL — ABNORMAL HIGH (ref 0.61–1.24)
GFR, Estimated: 35 mL/min — ABNORMAL LOW (ref >60)
Glucose, Bld: 157 mg/dL — ABNORMAL HIGH (ref 70–99)
Potassium: 4.4 mmol/L (ref 3.5–5.1)
Sodium: 138 mmol/L (ref 135–145)

## 2023-01-11 LAB — POCT ACTIVATED CLOTTING TIME
Activated Clotting Time: 122 seconds
Activated Clotting Time: 219 seconds
Activated Clotting Time: 281 seconds

## 2023-01-11 LAB — BPAM RBC
Blood Product Expiration Date: 202407032359
ISSUE DATE / TIME: 202406151422
ISSUE DATE / TIME: 202406151422
Unit Type and Rh: 6200
Unit Type and Rh: 6200
Unit Type and Rh: 7300
Unit Type and Rh: 7300

## 2023-01-11 LAB — LIPASE, BLOOD: Lipase: 62 U/L — ABNORMAL HIGH (ref 11–51)

## 2023-01-11 LAB — ABO/RH: ABO/RH(D): AB POS

## 2023-01-11 LAB — PREPARE RBC (CROSSMATCH)

## 2023-01-11 LAB — LACTIC ACID, PLASMA: Lactic Acid, Venous: 2.4 mmol/L (ref 0.5–1.9)

## 2023-01-11 LAB — MAGNESIUM: Magnesium: 1.6 mg/dL — ABNORMAL LOW (ref 1.7–2.4)

## 2023-01-11 LAB — PROTIME-INR
INR: 1.7 — ABNORMAL HIGH (ref 0.8–1.2)
Prothrombin Time: 19.7 seconds — ABNORMAL HIGH (ref 11.4–15.2)

## 2023-01-11 SURGERY — INSERTION, ENDOVASCULAR STENT GRAFT, AORTA, ABDOMINAL
Anesthesia: General | Site: Groin

## 2023-01-11 MED ORDER — HEPARIN SODIUM (PORCINE) 1000 UNIT/ML IJ SOLN
INTRAMUSCULAR | Status: DC | PRN
  Administered 2023-01-11: 3000 [IU] via INTRAVENOUS
  Administered 2023-01-11: 10000 [IU] via INTRAVENOUS
  Administered 2023-01-11: 5000 [IU] via INTRAVENOUS

## 2023-01-11 MED ORDER — ACETAMINOPHEN 325 MG PO TABS
325.0000 mg | ORAL_TABLET | ORAL | Status: DC | PRN
  Administered 2023-01-14: 650 mg via ORAL
  Filled 2023-01-11: qty 2

## 2023-01-11 MED ORDER — CEFAZOLIN SODIUM 1 G IJ SOLR
INTRAMUSCULAR | Status: AC
  Filled 2023-01-11: qty 20

## 2023-01-11 MED ORDER — CEFAZOLIN SODIUM-DEXTROSE 2-3 GM-%(50ML) IV SOLR
INTRAVENOUS | Status: DC | PRN
  Administered 2023-01-11: 2 g via INTRAVENOUS

## 2023-01-11 MED ORDER — FENTANYL CITRATE (PF) 250 MCG/5ML IJ SOLN
INTRAMUSCULAR | Status: AC
  Filled 2023-01-11: qty 5

## 2023-01-11 MED ORDER — CEFAZOLIN SODIUM-DEXTROSE 2-4 GM/100ML-% IV SOLN
2.0000 g | Freq: Three times a day (TID) | INTRAVENOUS | Status: AC
  Administered 2023-01-11 – 2023-01-12 (×2): 2 g via INTRAVENOUS
  Filled 2023-01-11 (×2): qty 100

## 2023-01-11 MED ORDER — ACETAMINOPHEN 325 MG RE SUPP: 325.0000 mg | RECTAL | Status: DC | PRN

## 2023-01-11 MED ORDER — MAGNESIUM SULFATE 2 GM/50ML IV SOLN
2.0000 g | Freq: Every day | INTRAVENOUS | Status: AC | PRN
  Administered 2023-01-11: 2 g via INTRAVENOUS
  Filled 2023-01-11: qty 50

## 2023-01-11 MED ORDER — SCOPOLAMINE 1 MG/3DAYS TD PT72
MEDICATED_PATCH | TRANSDERMAL | Status: AC
  Filled 2023-01-11: qty 2

## 2023-01-11 MED ORDER — ONDANSETRON HCL 4 MG/2ML IJ SOLN
INTRAMUSCULAR | Status: AC
  Filled 2023-01-11: qty 2

## 2023-01-11 MED ORDER — POLYETHYLENE GLYCOL 3350 17 G PO PACK: 17.0000 g | PACK | Freq: Every day | ORAL | Status: DC | PRN

## 2023-01-11 MED ORDER — LABETALOL HCL 5 MG/ML IV SOLN
INTRAVENOUS | Status: AC
  Filled 2023-01-11: qty 4

## 2023-01-11 MED ORDER — GUAIFENESIN-DM 100-10 MG/5ML PO SYRP: 15.0000 mL | ORAL_SOLUTION | ORAL | Status: DC | PRN

## 2023-01-11 MED ORDER — SODIUM CHLORIDE 0.9 % IV SOLN: 10.0000 mL/h | Freq: Once | INTRAVENOUS | Status: DC

## 2023-01-11 MED ORDER — SUGAMMADEX SODIUM 200 MG/2ML IV SOLN
INTRAVENOUS | Status: DC | PRN
  Administered 2023-01-11: 368 mg via INTRAVENOUS

## 2023-01-11 MED ORDER — PANTOPRAZOLE SODIUM 40 MG PO TBEC
40.0000 mg | DELAYED_RELEASE_TABLET | Freq: Every day | ORAL | Status: DC
  Administered 2023-01-11 – 2023-01-14 (×4): 40 mg via ORAL
  Filled 2023-01-11 (×4): qty 1

## 2023-01-11 MED ORDER — EPHEDRINE 5 MG/ML INJ
INTRAVENOUS | Status: AC
  Filled 2023-01-11: qty 5

## 2023-01-11 MED ORDER — OXYCODONE-ACETAMINOPHEN 5-325 MG PO TABS: 1.0000 | ORAL_TABLET | ORAL | Status: DC | PRN

## 2023-01-11 MED ORDER — LACTATED RINGERS IV SOLN: INTRAVENOUS | Status: DC | PRN

## 2023-01-11 MED ORDER — AMLODIPINE BESYLATE 5 MG PO TABS
5.0000 mg | ORAL_TABLET | Freq: Every day | ORAL | Status: DC
  Administered 2023-01-11 – 2023-01-14 (×4): 5 mg via ORAL
  Filled 2023-01-11 (×4): qty 1

## 2023-01-11 MED ORDER — METOPROLOL TARTRATE 5 MG/5ML IV SOLN: 2.0000 mg | INTRAVENOUS | Status: DC | PRN

## 2023-01-11 MED ORDER — SODIUM CHLORIDE 0.9 % IV SOLN: INTRAVENOUS | Status: AC

## 2023-01-11 MED ORDER — DEXAMETHASONE SODIUM PHOSPHATE 10 MG/ML IJ SOLN
INTRAMUSCULAR | Status: AC
  Filled 2023-01-11: qty 1

## 2023-01-11 MED ORDER — PROTAMINE SULFATE 10 MG/ML IV SOLN
INTRAVENOUS | Status: DC | PRN
  Administered 2023-01-11: 50 mg via INTRAVENOUS

## 2023-01-11 MED ORDER — ORAL CARE MOUTH RINSE: 15.0000 mL | OROMUCOSAL | Status: DC | PRN

## 2023-01-11 MED ORDER — ALUM & MAG HYDROXIDE-SIMETH 200-200-20 MG/5ML PO SUSP: 15.0000 mL | ORAL | Status: DC | PRN

## 2023-01-11 MED ORDER — PRAVASTATIN SODIUM 40 MG PO TABS
40.0000 mg | ORAL_TABLET | Freq: Every day | ORAL | Status: DC
  Administered 2023-01-11 – 2023-01-13 (×3): 40 mg via ORAL
  Filled 2023-01-11 (×3): qty 1

## 2023-01-11 MED ORDER — ONDANSETRON HCL 4 MG/2ML IJ SOLN
INTRAMUSCULAR | Status: DC | PRN
  Administered 2023-01-11: 4 mg via INTRAVENOUS

## 2023-01-11 MED ORDER — PHENOL 1.4 % MT LIQD: 1.0000 | OROMUCOSAL | Status: DC | PRN

## 2023-01-11 MED ORDER — EPHEDRINE SULFATE (PRESSORS) 50 MG/ML IJ SOLN
INTRAMUSCULAR | Status: DC | PRN
  Administered 2023-01-11: 10 mg via INTRAVENOUS

## 2023-01-11 MED ORDER — IODIXANOL 320 MG/ML IV SOLN
INTRAVENOUS | Status: DC | PRN
  Administered 2023-01-11: 76.4 mL
  Administered 2023-01-11: 35 mL

## 2023-01-11 MED ORDER — 0.9 % SODIUM CHLORIDE (POUR BTL) OPTIME
TOPICAL | Status: DC | PRN
  Administered 2023-01-11: 1000 mL

## 2023-01-11 MED ORDER — SODIUM CHLORIDE 0.9 % IV SOLN: 500.0000 mL | Freq: Once | INTRAVENOUS | Status: DC | PRN

## 2023-01-11 MED ORDER — ROCURONIUM BROMIDE 10 MG/ML (PF) SYRINGE
PREFILLED_SYRINGE | INTRAVENOUS | Status: AC
  Filled 2023-01-11: qty 20

## 2023-01-11 MED ORDER — ROCURONIUM BROMIDE 10 MG/ML (PF) SYRINGE
PREFILLED_SYRINGE | INTRAVENOUS | Status: DC | PRN
  Administered 2023-01-11 (×2): 20 mg via INTRAVENOUS
  Administered 2023-01-11: 50 mg via INTRAVENOUS
  Administered 2023-01-11: 20 mg via INTRAVENOUS

## 2023-01-11 MED ORDER — PROPOFOL 10 MG/ML IV BOLUS
INTRAVENOUS | Status: DC | PRN
  Administered 2023-01-11: 140 mg via INTRAVENOUS
  Administered 2023-01-11: 30 mg via INTRAVENOUS

## 2023-01-11 MED ORDER — LIDOCAINE 2% (20 MG/ML) 5 ML SYRINGE
INTRAMUSCULAR | Status: AC
  Filled 2023-01-11: qty 5

## 2023-01-11 MED ORDER — ONDANSETRON HCL 4 MG/2ML IJ SOLN: 4.0000 mg | Freq: Four times a day (QID) | INTRAMUSCULAR | Status: DC | PRN

## 2023-01-11 MED ORDER — ASPIRIN 81 MG PO TBEC
81.0000 mg | DELAYED_RELEASE_TABLET | Freq: Every day | ORAL | Status: DC
  Administered 2023-01-12 – 2023-01-14 (×3): 81 mg via ORAL
  Filled 2023-01-11 (×3): qty 1

## 2023-01-11 MED ORDER — LABETALOL HCL 5 MG/ML IV SOLN: 10.0000 mg | INTRAVENOUS | Status: DC | PRN

## 2023-01-11 MED ORDER — FENTANYL CITRATE (PF) 250 MCG/5ML IJ SOLN
INTRAMUSCULAR | Status: DC | PRN
  Administered 2023-01-11 (×3): 25 ug via INTRAVENOUS
  Administered 2023-01-11: 100 ug via INTRAVENOUS
  Administered 2023-01-11: 25 ug via INTRAVENOUS

## 2023-01-11 MED ORDER — POTASSIUM CHLORIDE CRYS ER 20 MEQ PO TBCR: 20.0000 meq | EXTENDED_RELEASE_TABLET | Freq: Every day | ORAL | Status: DC | PRN

## 2023-01-11 MED ORDER — DOCUSATE SODIUM 100 MG PO CAPS
100.0000 mg | ORAL_CAPSULE | Freq: Every day | ORAL | Status: DC
  Administered 2023-01-12 – 2023-01-14 (×3): 100 mg via ORAL
  Filled 2023-01-11 (×2): qty 1

## 2023-01-11 MED ORDER — PROTAMINE SULFATE 10 MG/ML IV SOLN
INTRAVENOUS | Status: AC
  Filled 2023-01-11: qty 5

## 2023-01-11 MED ORDER — FENTANYL CITRATE PF 50 MCG/ML IJ SOSY
25.0000 ug | PREFILLED_SYRINGE | Freq: Once | INTRAMUSCULAR | Status: AC
Start: 2023-01-11 — End: 2023-01-11
  Administered 2023-01-11: 25 ug via INTRAVENOUS
  Filled 2023-01-11: qty 1

## 2023-01-11 MED ORDER — DEXAMETHASONE SODIUM PHOSPHATE 10 MG/ML IJ SOLN
INTRAMUSCULAR | Status: DC | PRN
  Administered 2023-01-11: 10 mg via INTRAVENOUS

## 2023-01-11 MED ORDER — PHENYLEPHRINE HCL-NACL 20-0.9 MG/250ML-% IV SOLN
INTRAVENOUS | Status: DC | PRN
  Administered 2023-01-11: 10 ug/min via INTRAVENOUS

## 2023-01-11 MED ORDER — HEPARIN SODIUM (PORCINE) 1000 UNIT/ML IJ SOLN
INTRAMUSCULAR | Status: AC
  Filled 2023-01-11: qty 20

## 2023-01-11 MED ORDER — ESMOLOL HCL 100 MG/10ML IV SOLN
INTRAVENOUS | Status: DC | PRN
  Administered 2023-01-11 (×3): 20 mg via INTRAVENOUS

## 2023-01-11 MED ORDER — ALBUMIN HUMAN 5 % IV SOLN: INTRAVENOUS | Status: DC | PRN

## 2023-01-11 MED ORDER — HEPARIN SODIUM (PORCINE) 5000 UNIT/ML IJ SOLN
5000.0000 [IU] | Freq: Three times a day (TID) | INTRAMUSCULAR | Status: DC
  Administered 2023-01-12 – 2023-01-14 (×7): 5000 [IU] via SUBCUTANEOUS
  Filled 2023-01-11 (×5): qty 1

## 2023-01-11 MED ORDER — LABETALOL HCL 5 MG/ML IV SOLN
INTRAVENOUS | Status: DC | PRN
  Administered 2023-01-11: 5 mg via INTRAVENOUS

## 2023-01-11 MED ORDER — FINASTERIDE 5 MG PO TABS
5.0000 mg | ORAL_TABLET | Freq: Every day | ORAL | Status: DC
  Administered 2023-01-11 – 2023-01-14 (×4): 5 mg via ORAL
  Filled 2023-01-11 (×4): qty 1

## 2023-01-11 MED ORDER — LIDOCAINE 2% (20 MG/ML) 5 ML SYRINGE
INTRAMUSCULAR | Status: DC | PRN
  Administered 2023-01-11: 60 mg via INTRAVENOUS

## 2023-01-11 MED ORDER — PHENYLEPHRINE 80 MCG/ML (10ML) SYRINGE FOR IV PUSH (FOR BLOOD PRESSURE SUPPORT)
PREFILLED_SYRINGE | INTRAVENOUS | Status: AC
  Filled 2023-01-11: qty 10

## 2023-01-11 MED ORDER — SODIUM CHLORIDE 0.9 % IV SOLN: 10.0000 mL/h | Freq: Once | INTRAVENOUS | Status: AC

## 2023-01-11 MED ORDER — PHENYLEPHRINE HCL (PRESSORS) 10 MG/ML IV SOLN
INTRAVENOUS | Status: DC | PRN
  Administered 2023-01-11 (×3): 160 ug via INTRAVENOUS
  Administered 2023-01-11: 320 ug via INTRAVENOUS

## 2023-01-11 MED ORDER — SUCCINYLCHOLINE CHLORIDE 200 MG/10ML IV SOSY
PREFILLED_SYRINGE | INTRAVENOUS | Status: DC | PRN
  Administered 2023-01-11: 120 mg via INTRAVENOUS

## 2023-01-11 MED ORDER — MORPHINE SULFATE (PF) 2 MG/ML IV SOLN: 2.0000 mg | INTRAVENOUS | Status: DC | PRN

## 2023-01-11 MED ORDER — HEPARIN 6000 UNIT IRRIGATION SOLUTION
Status: DC | PRN
  Administered 2023-01-11: 1

## 2023-01-11 MED ORDER — FEBUXOSTAT 40 MG PO TABS
40.0000 mg | ORAL_TABLET | Freq: Every day | ORAL | Status: DC
  Administered 2023-01-12 – 2023-01-14 (×3): 40 mg via ORAL
  Filled 2023-01-11 (×3): qty 1

## 2023-01-11 MED ORDER — TAMSULOSIN HCL 0.4 MG PO CAPS
0.4000 mg | ORAL_CAPSULE | Freq: Every day | ORAL | Status: DC
  Administered 2023-01-11 – 2023-01-13 (×3): 0.4 mg via ORAL
  Filled 2023-01-11 (×3): qty 1

## 2023-01-11 MED ORDER — METOPROLOL SUCCINATE ER 100 MG PO TB24
100.0000 mg | ORAL_TABLET | Freq: Every day | ORAL | Status: DC
  Administered 2023-01-11 – 2023-01-14 (×3): 100 mg via ORAL
  Filled 2023-01-11: qty 2
  Filled 2023-01-11 (×2): qty 1

## 2023-01-11 MED ORDER — HYDRALAZINE HCL 20 MG/ML IJ SOLN: 5.0000 mg | INTRAMUSCULAR | Status: DC | PRN

## 2023-01-11 SURGICAL SUPPLY — 88 items
APL PRP STRL LF DISP 70% ISPRP (MISCELLANEOUS) ×4
APL SKNCLS NONHYPOALLERGENIC (GAUZE/BANDAGES/DRESSINGS) ×4
APL SKNCLS STERI-STRIP NONHPOA (GAUZE/BANDAGES/DRESSINGS) ×4
BAG COUNTER SPONGE SURGICOUNT (BAG) ×4 IMPLANT
BAG SPNG CNTER NS LX DISP (BAG) ×4
BENZOIN TINCTURE PRP APPL 2/3 (GAUZE/BANDAGES/DRESSINGS) ×4 IMPLANT
BLADE CLIPPER SURG (BLADE) ×4 IMPLANT
CANISTER SUCT 3000ML PPV (MISCELLANEOUS) ×4 IMPLANT
CATH BEACON 5 .035 40 KMP TP (CATHETERS) ×1 IMPLANT
CATH BEACON 5 .035 65 KMP TIP (CATHETERS) ×1 IMPLANT
CATH BEACON 5 .038 40 KMP TP (CATHETERS) ×4
CATH BEACON 5.038 65CM KMP-01 (CATHETERS) ×4 IMPLANT
CATH OMNI FLUSH .035X70CM (CATHETERS) ×4 IMPLANT
CATH VISIONS PV .035 IVUS (CATHETERS) ×1 IMPLANT
CHLORAPREP W/TINT 26 (MISCELLANEOUS) ×4 IMPLANT
CLIP TI MEDIUM 24 (CLIP) ×1 IMPLANT
CLIP TI WIDE RED SMALL 24 (CLIP) ×1 IMPLANT
CLOSURE PERCLOSE PROSTYLE (VASCULAR PRODUCTS) ×4 IMPLANT
COIL EMBL 14X103.2 LOOP (Embolic) ×1 IMPLANT
COIL NESTER 14X10 (Embolic) ×4 IMPLANT
COIL NESTER 14X12 (Embolic) ×6 IMPLANT
COIL NESTER 14X8 (Embolic) ×2 IMPLANT
DEVICE CLOSURE PERCLS PRGLD 6F (VASCULAR PRODUCTS) IMPLANT
DEVICE TORQUE KENDALL .025-038 (MISCELLANEOUS) ×1 IMPLANT
DRSG TEGADERM 2-3/8X2-3/4 SM (GAUZE/BANDAGES/DRESSINGS) ×8 IMPLANT
ELECT REM PT RETURN 9FT ADLT (ELECTROSURGICAL) ×8
ELECTRODE REM PT RTRN 9FT ADLT (ELECTROSURGICAL) ×8 IMPLANT
EXCLDR TRNK ENDO 26X14.5X12 16 (Endovascular Graft) ×4 IMPLANT
EXCLUDER TNK END 26X14.5X12 16 (Endovascular Graft) ×1 IMPLANT
GAUZE SPONGE 2X2 8PLY STRL LF (GAUZE/BANDAGES/DRESSINGS) ×8 IMPLANT
GLOVE BIO SURGEON STRL SZ8 (GLOVE) ×4 IMPLANT
GOWN STRL REUS W/ TWL LRG LVL3 (GOWN DISPOSABLE) ×8 IMPLANT
GOWN STRL REUS W/ TWL XL LVL3 (GOWN DISPOSABLE) ×4 IMPLANT
GOWN STRL REUS W/TWL LRG LVL3 (GOWN DISPOSABLE) ×8
GOWN STRL REUS W/TWL XL LVL3 (GOWN DISPOSABLE) ×4
GRAFT BALLN CATH 65CM (BALLOONS) ×4 IMPLANT
GUIDEWIRE ANGLED .035X150CM (WIRE) ×1 IMPLANT
KIT BASIN OR (CUSTOM PROCEDURE TRAY) ×4 IMPLANT
KIT DRAIN CSF ACCUDRAIN (MISCELLANEOUS) IMPLANT
KIT TURNOVER KIT B (KITS) ×4 IMPLANT
LEG CONTRALATERAL 16X12X10 (Vascular Products) ×4 IMPLANT
LEG CONTRALATERAL 16X12X14 (Vascular Products) ×4 IMPLANT
LEG CONTRALATERAL 16X20X9.5 (Endovascular Graft) ×4 IMPLANT
NS IRRIG 1000ML POUR BTL (IV SOLUTION) ×4 IMPLANT
PACK ENDOVASCULAR (PACKS) ×4 IMPLANT
PAD ARMBOARD 7.5X6 YLW CONV (MISCELLANEOUS) ×8 IMPLANT
PENCIL SMOKE EVACUATOR (MISCELLANEOUS) ×1 IMPLANT
PERCLOSE PROGLIDE 6F (VASCULAR PRODUCTS)
PLUG VASCULAR AMPLATZER 12MM (Vascular Products) ×1 IMPLANT
SET MICROPUNCTURE 5F STIFF (MISCELLANEOUS) ×4 IMPLANT
SHEATH BRITE TIP 8FR 23CM (SHEATH) ×4 IMPLANT
SHEATH DRYSEAL FLEX 12FR 33CM (SHEATH) ×1 IMPLANT
SHEATH DRYSEAL FLEX 16FR 33CM (SHEATH) ×1 IMPLANT
SHEATH DRYSEAL FLEX 18FR 33CM (SHEATH) ×1 IMPLANT
SHEATH FLEX ANSEL ANG 6F 45CM (SHEATH) ×1 IMPLANT
SHEATH HIGHFLEX ANSEL 6FRX55 (SHEATH) ×1 IMPLANT
SHEATH PINNACLE 8F 10CM (SHEATH) ×4 IMPLANT
SHEATH PINNACLE 9F 10CM (SHEATH) ×1 IMPLANT
SLEEVE ISOL F/PACE RF HD COVER (MISCELLANEOUS) ×1 IMPLANT
STENT GRAFT CONTRALAT 16X12X10 (Vascular Products) ×1 IMPLANT
STENT GRAFT CONTRALAT 16X12X14 (Vascular Products) ×1 IMPLANT
STENT GRAFT CONTRALAT 16X20X9. (Endovascular Graft) ×1 IMPLANT
STOPCOCK MORSE 400PSI 3WAY (MISCELLANEOUS) ×4 IMPLANT
STRIP CLOSURE SKIN 1/2X4 (GAUZE/BANDAGES/DRESSINGS) ×8 IMPLANT
SUT MNCRL AB 4-0 PS2 18 (SUTURE) ×9 IMPLANT
SUT PROLENE 4 0 RB 1 (SUTURE) ×4
SUT PROLENE 4-0 RB1 .5 CRCL 36 (SUTURE) ×1 IMPLANT
SUT PROLENE 5 0 C 1 24 (SUTURE) ×4 IMPLANT
SUT PROLENE 6 0 BV (SUTURE) ×1 IMPLANT
SUT SILK 2 0 (SUTURE) ×4
SUT SILK 2 0 SH (SUTURE) ×1 IMPLANT
SUT SILK 2-0 18XBRD TIE 12 (SUTURE) ×1 IMPLANT
SUT SILK 3 0 (SUTURE) ×4
SUT SILK 3-0 18XBRD TIE 12 (SUTURE) ×1 IMPLANT
SUT SILK 4 0 (SUTURE) ×4
SUT SILK 4-0 18XBRD TIE 12 (SUTURE) ×1 IMPLANT
SUT VIC AB 2-0 CT1 27 (SUTURE) ×4
SUT VIC AB 2-0 CT1 TAPERPNT 27 (SUTURE) ×1 IMPLANT
SUT VIC AB 3-0 SH 27 (SUTURE) ×4
SUT VIC AB 3-0 SH 27X BRD (SUTURE) ×1 IMPLANT
SYR 20CC LL (SYRINGE) ×8 IMPLANT
SYR BULB IRRIG 60ML STRL (SYRINGE) ×1 IMPLANT
TOWEL GREEN STERILE (TOWEL DISPOSABLE) ×4 IMPLANT
TRAY FOLEY MTR SLVR 16FR STAT (SET/KITS/TRAYS/PACK) ×4 IMPLANT
TUBING INJECTOR 48 (MISCELLANEOUS) ×4 IMPLANT
WIRE BENTSON .035X145CM (WIRE) ×8 IMPLANT
WIRE LUNDERQUIST .035X180CM (WIRE) ×8 IMPLANT
WIRE STARTER BENTSON 035X150 (WIRE) ×2 IMPLANT

## 2023-01-11 NOTE — H&P (Signed)
[facility] [place]  ASSESSMENT / PLAN: 82 y.o. 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 He has no hemodynamic compromise and is mentally alert. He has had pain for several days and now has scrotal ecchymosis. I suspect this has been ruptured for some time. He is safe to transfer. We will plan to repair this at [facility]. I counseled the patient and his family about the risky nature of this diagnosis. He has risk of death, MI, stroke, renal failure, etc. Even with succcessful repair. They are understanding and wish to proceed urgently.   CHIEF COMPLAINT: scrotal ecchymosis and low back pain  HISTORY OF PRESENT ILLNESS: Jose Baker is a 82 y.o. male who presents to [facility] emergency department for evaluation of scrotal bruising.  Patient is mildly demented and fairly stoic.  His wife noticed some bruising about his scrotum today and brought him to be evaluated.  Over the past several days the patient has noted lower back pain which has not disabling.  He and his wife figured he had just hurt his back and thought nothing more of it.  CT scan performed in the ER today shows ruptured left common iliac artery aneurysm that appears contained on noncontrast scan.  His hemodynamics are reassuring.  He is awake and alert.   Past Medical History:  Diagnosis Date   Arrhythmia    Atrial fibrillation (HCC)    CVA (cerebral vascular accident) (HCC)    Dementia (HCC)    GERD without esophagitis    HTN (hypertension)    Hypercholesteremia    Obesity    Sleep apnea     Past Surgical History:  Procedure Laterality Date   BRAIN SURGERY     CATARACT EXTRACTION     INCISION AND DRAINAGE ABSCESS Left 08/29/2021   Procedure: INCISION AND DRAINAGE ABSCESS of Left ARM;  Surgeon: Campbell Lerner, MD;  Location: [facility] ORS;  Service: General;  Laterality: Left;  Pt not NPO, but emergency status precludes NPO status.    Family History  Problem  Relation Age of Onset   Alzheimer's disease Other    Hypertension Other    Prostate cancer Neg Hx    Bladder Cancer Neg Hx    Kidney cancer Neg Hx     Social History   Socioeconomic History   Marital status: Unknown    Spouse name: Not on file   Number of children: Not on file   Years of education: Not on file   Highest education level: Not on file  Occupational History   Occupation: dairy farmer  Tobacco Use   Smoking status: Never   Smokeless tobacco: Never  Substance and Sexual Activity   Alcohol use: No   Drug use: No   Sexual activity: Not on file  Other Topics Concern   Not on file  Social History Narrative   Not on file   Social Determinants of Health   Financial Resource Strain: Not on file  Food Insecurity: Not on file  Transportation Needs: Not on file  Physical Activity: Not on file  Stress: Not on file  Social Connections: Not on file  Intimate Partner Violence: Not on file    Allergies  Allergen Reactions   Lipitor [Atorvastatin Calcium]     No current facility-administered medications for this encounter.   Current Outpatient Medications  Medication Sig Dispense Refill   amLODipine (NORVASC) 5 MG tablet Take 1 tablet (5 mg total) by mouth daily. 30 tablet  11   amoxicillin (AMOXIL) 500 MG capsule Take 2 capsules (1,000 mg total) by mouth 2 (two) times daily. 28 capsule 0   b complex vitamins tablet Take 1 tablet by mouth daily.     ELIQUIS 2.5 MG TABS tablet Take 2.5 mg by mouth 2 (two) times daily.     febuxostat (ULORIC) 40 MG tablet Take 40 mg by mouth daily.     finasteride (PROSCAR) 5 MG tablet Take 1 tablet (5 mg total) by mouth daily. 30 tablet 11   fish oil-omega-3 fatty acids 1000 MG capsule Take 2 g by mouth daily.     Melatonin 3 MG TABS Take 10 mg by mouth at bedtime.     metoprolol succinate (TOPROL-XL) 100 MG 24 hr tablet Take 100 mg by mouth daily.  1   omeprazole (PRILOSEC) 20 MG capsule 40 mg daily.     pravastatin (PRAVACHOL)  40 MG tablet SMARTSIG:1 Tablet(s) By Mouth Every Evening     tamsulosin (FLOMAX) 0.4 MG CAPS capsule Take 1 capsule (0.4 mg total) by mouth daily after supper. 30 capsule 2    PHYSICAL EXAM See nursing documentation for vital signs.  Heart rate on my evaluation in the 60s.  Systolic blood pressure on my evaluation in the 130s.  Elderly man in no acute distress Regular rate and rhythm Unlabored breathing Palpable femoral pulses bilaterally Palpable radial pulses bilaterally Mildly tender abdomen without distention Mild ecchymosis about the scrotum  PERTINENT LABORATORY AND RADIOLOGIC DATA  Most recent CBC    Latest Ref Rng & Units 01/11/2023   10:46 AM 09/06/2021   10:19 AM 09/01/2021    3:28 AM  CBC  WBC 4.0 - 10.5 K/uL 10.9  8.5  7.4   Hemoglobin 13.0 - 17.0 g/dL 9.0  16.1  9.6   Hematocrit 39.0 - 52.0 % 30.3  37.6  31.4   Platelets 150 - 400 K/uL 204  208  130      Most recent CMP    Latest Ref Rng & Units 01/11/2023   10:46 AM 09/06/2021   10:19 AM 09/02/2021    5:37 AM  CMP  Glucose 70 - 99 mg/dL 096  97  045   BUN 8 - 23 mg/dL 33  15  29   Creatinine 0.61 - 1.24 mg/dL 4.09  8.11  9.14   Sodium 135 - 145 mmol/L 139  135  138   Potassium 3.5 - 5.1 mmol/L 4.0  4.4  4.0   Chloride 98 - 111 mmol/L 105  101  106   CO2 22 - 32 mmol/L 22  27  26    Calcium 8.9 - 10.3 mg/dL 8.7  8.8  8.7   Total Protein 6.5 - 8.1 g/dL 6.8  7.1    Total Bilirubin 0.3 - 1.2 mg/dL 1.9  0.7    Alkaline Phos 38 - 126 U/L 60  65    AST 15 - 41 U/L 21  28    ALT 0 - 44 U/L 9  21     Noncontrast CT scan of the abdomen pelvis personally reviewed in detail there appears to be a ruptured left common iliac artery aneurysm that is contained by the retroperitoneum.  Rande Brunt. Lenell Antu, MD FACS [facility] Phone Number: [phone] 01/11/2023 12:30 PM   Total time spent on preparing this encounter including chart review, data review, collecting history, examining  the patient, coordinating care for this new patient, 60 minutes.  Portions of this report may have been transcribed using voice recognition software.  Every effort has been made to ensure accuracy; however, inadvertent computerized transcription errors may still be present.

## 2023-01-11 NOTE — ED Notes (Signed)
EMTALA Reviewed by this RN.  

## 2023-01-11 NOTE — ED Triage Notes (Signed)
Patient reports one of his testicles is black in color, also complaining of aching all over.

## 2023-01-11 NOTE — Transfer of Care (Signed)
Immediate Anesthesia Transfer of Care Note  Patient: Jose Baker  Procedure(s) Performed: ABDOMINAL AORTIC ENDOVASCULAR STENT GRAFT ULTRASOUND GUIDANCE FOR VASCULAR ACCESS (Bilateral: Groin) INTRAVASCULAR ULTRASOUND (IVUS) (Groin) LEFT INTERNAL ILIAC ANEURYSM COILING (Groin)  Patient Location: PACU  Anesthesia Type:General  Level of Consciousness: awake and alert   Airway & Oxygen Therapy: Patient Spontanous Breathing and Patient connected to nasal cannula oxygen  Post-op Assessment: Report given to RN and Post -op Vital signs reviewed and stable  Post vital signs: Reviewed and stable  Last Vitals:  Vitals Value Taken Time  BP 140/78 01/11/23 1700  Temp 36.4 C 01/11/23 1700  Pulse 80 01/11/23 1712  Resp 17 01/11/23 1712  SpO2 97 % 01/11/23 1712  Vitals shown include unvalidated device data.  Last Pain:  Vitals:   01/11/23 1700  PainSc: 0-No pain         Complications: No notable events documented.

## 2023-01-11 NOTE — Anesthesia Preprocedure Evaluation (Addendum)
Anesthesia Evaluation  Patient identified by MRN, date of birth, ID band Patient confused    Reviewed: Patient's Chart, lab work & pertinent test resultsPreop documentation limited or incomplete due to emergent nature of procedure.  Airway Mallampati: II  TM Distance: >3 FB Neck ROM: Full    Dental no notable dental hx.    Pulmonary sleep apnea    Pulmonary exam normal        Cardiovascular hypertension, Pt. on medications + Peripheral Vascular Disease  + dysrhythmias Atrial Fibrillation  Rhythm:Irregular Rate:Normal  Ruptured left common iliac aneurysm    Neuro/Psych       Dementia CVA    GI/Hepatic Neg liver ROS,GERD  Medicated,,  Endo/Other  negative endocrine ROS    Renal/GU   negative genitourinary   Musculoskeletal  (+) Arthritis , Osteoarthritis,    Abdominal Normal abdominal exam  (+)   Peds  Hematology Lab Results      Component                Value               Date                      WBC                      10.9 (H)            01/11/2023                HGB                      9.0 (L)             01/11/2023                HCT                      30.3 (L)            01/11/2023                MCV                      88.6                01/11/2023                PLT                      204                 01/11/2023             Lab Results      Component                Value               Date                      NA                       139                 01/11/2023                K  4.0                 01/11/2023                CO2                      22                  01/11/2023                GLUCOSE                  155 (H)             01/11/2023                BUN                      33 (H)              01/11/2023                CREATININE               2.07 (H)            01/11/2023                CALCIUM                  8.7 (L)             01/11/2023                 GFRNONAA                 31 (L)              01/11/2023              Anesthesia Other Findings   Reproductive/Obstetrics                             Anesthesia Physical Anesthesia Plan  ASA: 4 and emergent  Anesthesia Plan: General   Post-op Pain Management:    Induction: Intravenous  PONV Risk Score and Plan: 2 and Ondansetron, Dexamethasone and Treatment may vary due to age or medical condition  Airway Management Planned: Mask and Oral ETT  Additional Equipment: Arterial line  Intra-op Plan:   Post-operative Plan: Possible Post-op intubation/ventilation  Informed Consent:      Only emergency history available  Plan Discussed with: CRNA  Anesthesia Plan Comments:        Anesthesia Quick Evaluation

## 2023-01-11 NOTE — Anesthesia Procedure Notes (Signed)
Arterial Line Insertion Start/End6/15/2024 1:10 PM, 01/11/2023 1:21 PM Performed by: Atilano Median, DO, anesthesiologist  Patient location: OR. Preanesthetic checklist: patient identified, IV checked, site marked, risks and benefits discussed, surgical consent, monitors and equipment checked, pre-op evaluation, timeout performed and anesthesia consent Emergency situation Lidocaine 1% used for infiltration Left, radial was placed Catheter size: 20 G Hand hygiene performed  and maximum sterile barriers used   Attempts: 4 Procedure performed using ultrasound guided technique. Ultrasound Notes:anatomy identified, needle tip was noted to be adjacent to the nerve/plexus identified, no ultrasound evidence of intravascular and/or intraneural injection and image(s) printed for medical record Following insertion, dressing applied and Biopatch. Post procedure assessment: normal and unchanged  Post procedure complications: local hematoma, unsuccessful attempts and second provider assisted. Patient tolerated the procedure well with no immediate complications.

## 2023-01-11 NOTE — ED Notes (Signed)
Pt's signed physical copy of consent to transfer.

## 2023-01-11 NOTE — Op Note (Signed)
DATE OF SERVICE: 01/11/2023  PATIENT:  Jose Baker  82 y.o. male  PRE-OPERATIVE DIAGNOSIS:  ruptured left common iliac artery aneurysm  POST-OPERATIVE DIAGNOSIS:  Same  PROCEDURE:   1) bilateral ultrasound guided common femoral artery access  2) coil and plug embolization of left internal iliac artery 2) endovascular aortic and iliac aneurysm repair for rupture  3) extension of repair onto left external iliac artery 4) intravascular ultrasound of infrarenal aorta 5) open exposure and repair of left common femoral artery  SURGEON:  Surgeon(s) and Role:    * Leonie Douglas, MD - Primary  ASSISTANT: Aggie Moats, PA-C  An experienced assistant was required given the complexity of this procedure and the standard of surgical care. My assistant helped with exposure through counter tension, suctioning, ligation and retraction to better visualize the surgical field.  My assistant expedited sewing during the case by following my sutures. Wherever I use the term "we" in the report, my assistant actively helped me with that portion of the procedure.  ANESTHESIA:   general  EBL:  BLOOD ADMINISTERED: 4u CC PRBC  DRAINS: none   LOCAL MEDICATIONS USED:  NONE  SPECIMEN:  none  COUNTS: confirmed correct.  TOURNIQUET:  none  PATIENT DISPOSITION:  PACU - hemodynamically stable.   Delay start of Pharmacological VTE agent (>24hrs) due to surgical blood loss or risk of bleeding: no  INDICATION FOR PROCEDURE: Jose Baker is a 82 y.o. male with ruptured left common iliac artery aneurysm. The patient was offered emergent repair. I did have a discussion with the family regarding risk / benefit / alternatives. They were understanding and wished to proceed emergently to the OR.   OPERATIVE FINDINGS: Contained rupture on initial angiogram.  No active extravasation noted prior to intervention.  Successful endovascular treatment of ruptured left common iliac artery aneurysm, with: Plug  embolization of the left internal iliac artery and extension of the repair into the left external neck artery.  Access in the left groin was complicated and most of the blood loss occurred from this.  I had to cut down at the beginning of the case to obtain hemostasis and upsized our sheath with good result.  Ultimately I repaired this using open technique at the end of the case with multiple interrupted Prolene sutures with good result.  ENDOVASCULAR EQUIPMENT USED: For left internal iliac artery embolization: 12 mm Amplatzer plug  12 x 140 mm Bester coil x 6 10 x 140 mm Nester coil x 3 8 x 140 millimeter Nester coil x 1  For EVAR: From right common femoral access 26 x 14 x 120 Gore excluder main body endoprosthesis 20 x 95 mm Gore iliac extension limb 18 French dry seal sheath  From the left common femoral access 12x 140 mm Gore  iliac extension limb 12 x 100 mm Gore iliac extension limb  DESCRIPTION OF PROCEDURE: After identification of the patient in the pre-operative holding area, the patient was transferred to the operating room. The patient was positioned supine on the operating room table. Anesthesia was induced. The abdomen, groins were prepped and draped in standard fashion. A surgical pause was performed confirming correct patient, procedure, and operative location.  Intraoperative ultrasound was used to obtain micropuncture access in the right common femoral artery.  Bentson wire was navigated into the aorta.  Access was upsized with an 8 [place] dilator.  Small incision was made around the access.  Perclose sutures were placed at 10:00 and 2:00 on the  arteriotomy and secured with shod clamps for use at the end of the case.  Intraoperative ultrasound was used to obtain micropuncture access in the left common femoral artery.  Bentson wire was navigated into the aorta.  Access was upsized with an 8 [place] dilator.  Small incision was made around the access.  Perclose sutures were  placed at 10:00 and 2:00 on the arteriotomy and secured with shod clamps for use at the end of the case.  Bleeding was noted from the access on the left.  Will try to upsize the access to 12 [place], but bleeding was still noted.  There is concern that the Perclose devices have created a rent in the arteriotomy.  I would like incision was made in the groin and carried down to the common femoral artery.  Was able to obtain digital control rather quickly.  A pursestring suture was placed around the artery with limited effect.  We did ultimately upsized the access on the left to 12 [place] and this seal the arteriotomy.  I planned to repair this at the end of the case.  From the right sided access I selected the suprarenal aorta with a Omni Flush catheter.  Aortogram and pelvic angiogram was performed.  The left common iliac artery aneurysm identified without active extravasation.  The takeoff of the left internal iliac artery was identified.  Using a variety of wire and catheter exchanges I was able to cannulate the left internal iliac artery from the right access.  Multiple coils were used to pack the internal iliac artery.  Contrast still flowed around the coils after a tight packing.  Elected using Amplatzer plug this was deployed in standard fashion with good result and cessation of antegrade flow into the anterior posterior division of the internal iliac artery.  Bentson wires were then used from bilateral femoral access to select the descending thoracic aorta.  A Kumpe catheter was navigated over the wires and wires exchanged for Lunderquist wires.  On the right access was upsized to an 29 [place] dry seal.  On the left access was upsized to a 12 [place] dry seal.  The main body of the endoprosthesis was delivered through the right sided access to the level of L2.  Over the left-sided access a Omni Flush catheter was advanced to the level of L2.  An aortogram was performed.  The endoprosthesis was  partially deployed in the gate open.  Using "buddy wire technique from the left-sided access I was able to select the gate with some difficulty.  I advanced an Omni flush catheter into the main body of the endoprosthesis to confirm intraluminal position.  Once confirmed, wire was exchanged for a Lunderquist wire.  The sheath was then delivered with a dilator into the gate.  2 iliac extension limbs were delivered (see above for device details).  This was deployed beyond the bifurcation of the left common iliac artery into the external iliac artery in the midportion of the artery.  Deployment of the main body was then completed on the right side.  A 20 x 95 mm iliac extension limb was then delivered with sufficient overlap to just short of the takeoff of the hypogastric artery.  Molding and occlusion balloon was used to angioplasty all segments of seal and overlap.  A completion angiogram was performed.  This showed a possible type III leak in the left iliac limb.  The overlap was more aggressively angioplastied with good technical result on completion angiogram.  No further  evidence of endoleak was seen.  We saw no 1A, 1B, 2, or 3 endoleak at completion.  Satisfied we ended the case here.  I will endovascular equipment was removed from the right side.  Manual pressure was held in the common femoral artery.  Previously placed Perclose sutures were secured down.  Hemostasis was good.  Attention was turned to the left groin.  The common femoral artery was skeletonized for several centimeters.  A Henley clamp was applied to the proximal common femoral artery and the distal common femoral artery.  A side branch was identified and a Gregory clamp was applied to this.  All endovascular equipment was removed with good hemostasis.  The arteriotomy was repaired with several interrupted sutures of 5 and 6-0 Prolene.  Result was achieved.  The repair was flushed retrograde.  The repair was flushed antegrade.  Several  repair stitches were needed hemostasis was achieved a palpable pulse was noted across the repair.  Doppler flow was confirmed in the bilateral feet.  Heparin was reversed with protamine.  The wound was closed in layers using 2-0 Vicryl, 3-0 Vicryl, 4-0 Monocryl.  Upon completion of the case instrument and sharps counts were confirmed correct. The patient was transferred to the  PACU in good condition. I was present for all portions of the procedure.  FOLLOW UP PLAN: Patient to be admitted to the ICU.  Ongoing resuscitation.  He is requiring 4 units of blood.  Will watch him carefully in the ICU anterior he is being resuscitated adequately.  Assuming a normal postoperative course, I will see the patient in 4 weeks with CT angiogram of the abdomen and pelvis.   Rande Brunt. Lenell Antu, MD [facility] Phone Number: [phone] 01/11/2023 4:11 PM

## 2023-01-11 NOTE — ED Provider Notes (Addendum)
[facility] Provider Note    Event Date/Time   First MD Initiated Contact with Patient 01/11/23 1041     (approximate)   History   Testicle Pain  EM caveat some confusion possibly underlying dementia as described by family  HPI  Jose Baker is a 82 y.o. male history includes hypertension lipidemia, currently listed as taking Eliquis paroxysmal A-fib  Patient has been complaining of some pain in his lower back now for about a week.  He is also been eating poorly.  His wife noticed that he started to have a little bit of swelling around his scrotum and darkness or bruising at his testicle today.  He is also complaining of lower abdominal pain.  They have not seen any swelling or bruising and are not aware of any falls or injuries.  He does take Eliquis but has not taken it today  Patient does have a history of dementia or mild cognitive impairment which family reports due to previous major surgery for aneurysm    Discharge summary August 30, 2021 patient was treated for metacarpal sepsis  Physical Exam   Triage Vital Signs: ED Triage Vitals [01/11/23 1037]  Enc Vitals Group     BP (!) 124/98     Pulse      Resp      Temp      Temp src      SpO2      Weight      Height      Head Circumference      Peak Flow      Pain Score      Pain Loc      Pain Edu?      Excl. in GC?     Most recent vital signs: Vitals:   01/11/23 1200 01/11/23 1202  BP:  115/76  Pulse: 70 71  Resp: 17 16  Temp:  97.7 F (36.5 C)  SpO2: 100% 100%     General: Awake, no distress.  CV:  Good peripheral perfusion.  Normal tones and rate Resp:  Normal effort.  Lungs bilateral Abd:  No distention.  Reports moderate to severe reproducible tenderness to the left lower quadrant only.  He has a soft nontender reducible umbilical hernia.  There is no obvious groin pain or masses or hernias.  Groin regions.  The remainder of his abdomen seems to be relatively atraumatic  and nontender.  There is no bruising of the abdomen or flanks or back.  Discrepant appears slightly edematous very minor, and has no tenderness to palpation of the testicles and epididymis or shaft.  However he does have some slight bruising or ecchymosis along the posterior dependent portions of the scrotum Other:     ED Results / Procedures / Treatments   Labs (all labs ordered are listed, but only abnormal results are displayed) Labs Reviewed  CBC - Abnormal; Notable for the following components:      Result Value   WBC 10.9 (*)    RBC 3.42 (*)    Hemoglobin 9.0 (*)    HCT 30.3 (*)    MCHC 29.7 (*)    RDW 15.6 (*)    All other components within normal limits  COMPREHENSIVE METABOLIC PANEL - Abnormal; Notable for the following components:   Glucose, Bld 155 (*)    BUN 33 (*)    Creatinine, Ser 2.07 (*)    Calcium 8.7 (*)    Total Bilirubin 1.9 (*)  GFR, Estimated 31 (*)    All other components within normal limits  LIPASE, BLOOD - Abnormal; Notable for the following components:   Lipase 62 (*)    All other components within normal limits  LACTIC ACID, PLASMA - Abnormal; Notable for the following components:   Lactic Acid, Venous 2.4 (*)    All other components within normal limits  CULTURE, BLOOD (ROUTINE X 2)  CULTURE, BLOOD (ROUTINE X 2)  TYPE AND SCREEN  TYPE AND SCREEN  TYPE AND SCREEN   Labs notable for appropriate platelet count. Acute on chronic kidney disease Hemoglobin stable for discharge baseline  EKG  EKG interpreted by me at 1130 heart rate 75 QRS 90 QTc 540 Atrial fibrillation, mild ST segment abnormality including mild depression in V3 V4 V5.  Not diagnostic of STEMI   RADIOLOGY   Personally interpreted the patient's CT is concerning for acute vascular rupture. After I spoke with Dr. Ollen Bowl for stat consult as well as Dr. Margo Aye   CLINICAL DATA:  82 year old male with left lower quadrant abdominal pain and scrotal bruising.  EXAM: CT  ABDOMEN AND PELVIS WITHOUT CONTRAST  TECHNIQUE: Multidetector CT imaging of the abdomen and pelvis was performed following the standard protocol without IV contrast.  RADIATION DOSE REDUCTION: This exam was performed according to the departmental dose-optimization program which includes automated exposure control, adjustment of the mA and/or kV according to patient size and/or use of iterative reconstruction technique.  COMPARISON:  CT Abdomen and Pelvis 04/14/2020.  FINDINGS: Lower chest: Stable cardiomegaly. No pericardial or pleural effusion. 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 Associated left lower lobe atelectasis.  Hepatobiliary: Small volume perihepatic hemoperitoneum, series 2, image 24. Otherwise negative noncontrast liver and gallbladder.  Pancreas: Negative.  Spleen: Negative.  Adrenals/Urinary Tract: Adrenal glands and kidneys are stable since 2021. Chronic simple fluid density renal cysts (no follow-up imaging recommended).  Retroperitoneal space predominant hemorrhage in the lower abdomen and pelvis, including blood tracking along the left pre renal and lateral conal fascia. Also hemorrhage in the space of Retzius, and the urinary bladder is elongated tracking anteriorly and superiorly from the pelvis into the right lower quadrant. No hydronephrosis or hydroureter.  Stomach/Bowel: Lower abdominal and pelvic hemoperitoneum and retroperitoneal hemorrhage. No dilated large or small bowel. 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 Large bowel diverticulosis.  Vascular/Lymphatic: Abnormal. There is evidence of ruptured aneurysm of the left Common iliac artery (series 2, image 64) with mostly prevertebral, retroperitoneal space hemorrhage - but also some peritoneal space hemorrhage. In 2021 partially calcified aneurysm of the left Common iliac artery was about 3.4 cm diameter. The area  now measures up to 5.7 cm.  The upstream abdominal aorta seems to remain normal to the aortoiliac bifurcation aside from chronic calcified atherosclerosis. There is no convincing aortic segment aneurysm.  Reproductive: Scrotal hydroceles but otherwise negative.  Other: Evidence of combined retroperitoneal hemorrhage and some hemoperitoneum in the pelvis.  Musculoskeletal: Stable degeneration. No acute osseous abnormality identified.  IMPRESSION: 1. Noncontrast CT appearance of Ruptured Left Common iliac Artery Aneurysm. Combined retroperitoneal and peritoneal space hemorrhage. Recommend emergent Vascular Surgery consultation. 2. Underlying aortoiliac atherosclerosis, but no convincing AAA. Aortic Atherosclerosis (ICD10-I70.0). 3. Regional mass effect on other lower abdominal and pelvic viscera. No other acute finding.     PROCEDURES:  Critical Care performed: Yes, see critical care procedure note(s)  CRITICAL CARE Performed by: Sharyn Creamer   Total critical care time: 60  minutes  Critical care time was exclusive of separately billable procedures and treating other patients.  Critical care was necessary to treat or prevent imminent or life-threatening deterioration.  Critical care was time spent personally by me on the following activities: development of treatment plan with patient and/or surrogate as well as nursing, discussions with consultants, evaluation of patient's response to treatment, examination of patient, obtaining history from patient or surrogate, ordering and performing treatments and interventions, ordering and review of laboratory studies, ordering and review of radiographic studies, pulse oximetry and re-evaluation of patient's condition.   Procedures   MEDICATIONS ORDERED IN ED: Medications  fentaNYL (SUBLIMAZE) injection 25 mcg (25 mcg Intravenous Given 01/11/23 1102)     IMPRESSION / MDM / ASSESSMENT AND PLAN / ED COURSE  I reviewed the triage  vital signs and the nursing notes.                             Differential diagnosis includes but is not limited to, abdominal perforation, aortic dissection, cholecystitis, appendicitis, diverticulitis, colitis, esophagitis/gastritis, kidney stone, pyelonephritis, urinary tract infection, aortic aneurysm. All are considered in decision and treatment plan. Based upon the patient's presentation and risk factors, the only reduced GFR I have ordered a stat abdomen pelvis without contrast to assess for acute gross including left lower quadrant pathology such as incarcerated hernia, perforation, diverticulitis, but also consideration of the concern for lower back pain has some bruising in the scrotum and other causes such as contained aneurysmal rupture AAA dissection etc. are high on the differential and some of this may not be ideal for noncontrasted imaging but based on his previous labs from recently I suspect contrast related nephropathy is a real concern for him  ----------------------------------------- 11:43 AM on 01/11/2023 ----------------------------------------- Vital signs remained stable.  Consulted with and discussed case with Dr. Butch Penny, he advised that he is also the vascular surgeon on-call at [facility] and that he advises that emergency transfer be made to [facility], ER.  He is the accepting physician, and will anticipate seeing the patient both here and also at [facility].  He advises he is currently mobilizing the OR staff at [facility].  At this juncture blood pressure is adequately controlled without antihypertensive, and the patient is up from 2 last taken his anticoagulant yesterday evening.  Vascular surgery recommends against immediate reversal    Patient's presentation is most consistent with acute presentation with potential threat to life or bodily function.   The patient is on the cardiac monitor to evaluate for evidence of arrhythmia and/or  significant heart rate changes.   Clinical Course as of 01/11/23 1208  Sat Jan 11, 2023  1127 Reviewed CT myself, high concern for ruptured AAA. Dr. Butch Penny paged as well as [facility] message sent. [MQ]  1127 Discussed CT with Dr. Margo Aye. Radiology advises this is likely a LEFT  [MQ]  1128 Sided ruptured illiac artery rupture. Final report to follow by Dr Margo Aye [MQ]    Clinical Course User Index [MQ] Sharyn Creamer, MD   Patient accepted by Dr. Lenell Antu to [facility], requests ER to ER transfer. I also discussed with Dr. Denton Lank (ED physician) who is aware of trasnfer incoming for critical patient.  Patient and his family understanding agreeable with plan to transfer emergently to [facility].  Critical care track not immediately available, most expeditious appropriate service  EMS.  Patient alert oriented no respiratory or acute cardiopulmonary  distress.  He appears reasonably stable for transfer with the need for acute vascular surgical services that per our vascular surgeon cannot immediately available at [facility] but are available at [facility].  Vitals:   01/11/23 1200 01/11/23 1202  BP:  115/76  Pulse: 70 71  Resp: 17 16  Temp:  97.7 F (36.5 C)  SpO2: 100% 100%   Given that the patient started experiencing back pain about a week ago and pain throughout yesterday bruising in the scrotum today I suspect this aneurysm is at least somewhat controlled.  At this juncture there is no clear indication for emergent blood transfusion, rather I believe emergent operative management vascular surgery is likely to be most needed Path at this point.  He appears appropriate for transfer at this time to [facility] via ALS emergency traffic, I personally discussed the case with paramedic Chase Picket who is performing the transfer.  Patient is alert and oriented denies any ongoing pain or concerns at time of transfer to EMS stretcher. Stable for transfer for emergent condition  Dr. Butch Penny will  see patient at [facility] ED  FINAL CLINICAL IMPRESSION(S) / ED DIAGNOSES   Final diagnoses:  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)     Rx / DC Orders   ED Discharge Orders     None        Note:  This document was prepared using Dragon voice recognition software and may include unintentional dictation errors.   Sharyn Creamer, MD 01/11/23 1216    Sharyn Creamer, MD 01/11/23 1351

## 2023-01-11 NOTE — Anesthesia Procedure Notes (Signed)
Procedure Name: Intubation Date/Time: 01/11/2023 1:30 PM  Performed by: Darryl Nestle, CRNAPre-anesthesia Checklist: Patient identified, Emergency Drugs available, Suction available and Patient being monitored Patient Re-evaluated:Patient Re-evaluated prior to induction Oxygen Delivery Method: Circle system utilized Preoxygenation: Pre-oxygenation with 100% oxygen Induction Type: IV induction Ventilation: Mask ventilation without difficulty Laryngoscope Size: Mac and 4 Grade View: Grade II Tube type: Oral Tube size: 8.0 mm Number of attempts: 1 Airway Equipment and Method: Stylet and Oral airway Placement Confirmation: ETT inserted through vocal cords under direct vision, positive ETCO2 and breath sounds checked- equal and bilateral Secured at: 23 cm Tube secured with: Tape Dental Injury: Teeth and Oropharynx as per pre-operative assessment

## 2023-01-11 NOTE — Anesthesia Postprocedure Evaluation (Signed)
Anesthesia Post Note  Patient: Jose Baker  Procedure(s) Performed: ABDOMINAL AORTIC ENDOVASCULAR STENT GRAFT ULTRASOUND GUIDANCE FOR VASCULAR ACCESS (Bilateral: Groin) INTRAVASCULAR ULTRASOUND (IVUS) (Groin) LEFT INTERNAL ILIAC ANEURYSM COILING (Groin)     Patient location during evaluation: PACU Anesthesia Type: General Level of consciousness: awake and alert Pain management: pain level controlled Vital Signs Assessment: post-procedure vital signs reviewed and stable Respiratory status: spontaneous breathing, nonlabored ventilation, respiratory function stable and patient connected to nasal cannula oxygen Cardiovascular status: blood pressure returned to baseline and stable Postop Assessment: no apparent nausea or vomiting Anesthetic complications: no   No notable events documented.  Last Vitals:  Vitals:   01/11/23 1730 01/11/23 1745  BP:    Pulse: 75 84  Resp:    Temp: 36.5 C   SpO2: 99% 96%    Last Pain:  Vitals:   01/11/23 1730  TempSrc: Oral  PainSc: 0-No pain                 Earl Lites P Tinita Brooker

## 2023-01-12 LAB — CBC
HCT: 27.4 % — ABNORMAL LOW (ref 39.0–52.0)
Hemoglobin: 9.1 g/dL — ABNORMAL LOW (ref 13.0–17.0)
MCH: 27.7 pg (ref 26.0–34.0)
MCHC: 33.2 g/dL (ref 30.0–36.0)
MCV: 83.5 fL (ref 80.0–100.0)
Platelets: 111 10*3/uL — ABNORMAL LOW (ref 150–400)
RBC: 3.28 MIL/uL — ABNORMAL LOW (ref 4.22–5.81)
RDW: 16.7 % — ABNORMAL HIGH (ref 11.5–15.5)
WBC: 11.1 10*3/uL — ABNORMAL HIGH (ref 4.0–10.5)
nRBC: 0 % (ref 0.0–0.2)

## 2023-01-12 LAB — TYPE AND SCREEN

## 2023-01-12 LAB — BASIC METABOLIC PANEL
Anion gap: 11 (ref 5–15)
BUN: 36 mg/dL — ABNORMAL HIGH (ref 8–23)
CO2: 20 mmol/L — ABNORMAL LOW (ref 22–32)
Calcium: 8 mg/dL — ABNORMAL LOW (ref 8.9–10.3)
Chloride: 102 mmol/L (ref 98–111)
Creatinine, Ser: 2.15 mg/dL — ABNORMAL HIGH (ref 0.61–1.24)
GFR, Estimated: 30 mL/min — ABNORMAL LOW (ref >60)
Glucose, Bld: 134 mg/dL — ABNORMAL HIGH (ref 70–99)
Potassium: 4.9 mmol/L (ref 3.5–5.1)
Sodium: 133 mmol/L — ABNORMAL LOW (ref 135–145)

## 2023-01-12 LAB — BPAM RBC: Blood Product Expiration Date: 202407022359

## 2023-01-12 MED ORDER — DEXMEDETOMIDINE HCL IN NACL 400 MCG/100ML IV SOLN
0.4000 ug/kg/h | INTRAVENOUS | Status: DC
  Filled 2023-01-12: qty 50
  Filled 2023-01-12: qty 100

## 2023-01-12 MED ORDER — DEXMEDETOMIDINE HCL IN NACL 400 MCG/100ML IV SOLN
INTRAVENOUS | Status: AC
  Administered 2023-01-12: 0.4 ug/kg/h via INTRAVENOUS
  Filled 2023-01-12: qty 100

## 2023-01-12 MED ORDER — CHLORHEXIDINE GLUCONATE CLOTH 2 % EX PADS
6.0000 | MEDICATED_PAD | Freq: Every day | CUTANEOUS | Status: DC
  Administered 2023-01-12 – 2023-01-13 (×2): 6 via TOPICAL

## 2023-01-12 MED ORDER — QUETIAPINE FUMARATE 25 MG PO TABS
25.0000 mg | ORAL_TABLET | Freq: Once | ORAL | Status: AC
  Administered 2023-01-12: 25 mg via ORAL
  Filled 2023-01-12: qty 1

## 2023-01-12 NOTE — Progress Notes (Signed)
[facility] [place] PROGRESS NOTE  ASSESSMENT / PLAN: Jose Baker is a 82 y.o. male s/p EVAR, LIIA embolization, extension of repair onto LEIA for ruptured left common iliac artery aneurysm 01/11/23. Doing very well.  PRN Pain control Diet as tolerated PT / OT / OOB / Ambulate Transfer to 4E   SUBJECTIVE: Doing well. No complaints.   OBJECTIVE: BP 117/81   Pulse 81   Temp 98.5 F (36.9 C) (Oral)   Resp 18   Wt 96.3 kg   SpO2 (!) 87%   BMI 38.83 kg/m   Intake/Output Summary (Last 24 hours) at 01/12/2023 1119 Last data filed at 01/12/2023 0700 Gross per 24 hour  Intake 3458.05 ml  Output 1545 ml  Net 1913.05 ml    Well appearing older man in no distress Regular rate and rhythm Unlabored breathing Soft, non-tender abdomen Groins clean and dry 2+ DP pulses     Latest Ref Rng & Units 01/12/2023    4:53 AM 01/11/2023    4:45 PM 01/11/2023    4:25 PM  CBC  WBC 4.0 - 10.5 K/uL 11.1  10.5    Hemoglobin 13.0 - 17.0 g/dL 9.1  8.6  8.5   Hematocrit 39.0 - 52.0 % 27.4  27.7  25.0   Platelets 150 - 400 K/uL 111  129          Latest Ref Rng & Units 01/12/2023    4:53 AM 01/11/2023    4:45 PM 01/11/2023    4:25 PM  CMP  Glucose 70 - 99 mg/dL 161  096    BUN 8 - 23 mg/dL 36  29    Creatinine 0.45 - 1.24 mg/dL 4.09  8.11    Sodium 914 - 145 mmol/L 133  138  139   Potassium 3.5 - 5.1 mmol/L 4.9  4.4  4.7   Chloride 98 - 111 mmol/L 102  107    CO2 22 - 32 mmol/L 20  21    Calcium 8.9 - 10.3 mg/dL 8.0  7.7      Daneen Volcy N. Lenell Antu, MD [facility] of [facility] Phone Number: [phone] 01/12/2023 11:19 AM

## 2023-01-13 ENCOUNTER — Telehealth: Payer: Self-pay | Admitting: Vascular Surgery

## 2023-01-13 LAB — CBC
HCT: 28.5 % — ABNORMAL LOW (ref 39.0–52.0)
Hemoglobin: 8.9 g/dL — ABNORMAL LOW (ref 13.0–17.0)
MCH: 26.7 pg (ref 26.0–34.0)
MCHC: 31.2 g/dL (ref 30.0–36.0)
MCV: 85.6 fL (ref 80.0–100.0)
Platelets: 144 10*3/uL — ABNORMAL LOW (ref 150–400)
RBC: 3.33 MIL/uL — ABNORMAL LOW (ref 4.22–5.81)
RDW: 16.9 % — ABNORMAL HIGH (ref 11.5–15.5)
WBC: 9.3 10*3/uL (ref 4.0–10.5)
nRBC: 0 % (ref 0.0–0.2)

## 2023-01-13 LAB — BASIC METABOLIC PANEL
Anion gap: 16 — ABNORMAL HIGH (ref 5–15)
BUN: 44 mg/dL — ABNORMAL HIGH (ref 8–23)
CO2: 21 mmol/L — ABNORMAL LOW (ref 22–32)
Calcium: 8.8 mg/dL — ABNORMAL LOW (ref 8.9–10.3)
Chloride: 101 mmol/L (ref 98–111)
Creatinine, Ser: 2.18 mg/dL — ABNORMAL HIGH (ref 0.61–1.24)
GFR, Estimated: 29 mL/min — ABNORMAL LOW (ref >60)
Glucose, Bld: 89 mg/dL (ref 70–99)
Potassium: 4.6 mmol/L (ref 3.5–5.1)
Sodium: 138 mmol/L (ref 135–145)

## 2023-01-13 NOTE — Progress Notes (Addendum)
  Progress Note    01/13/2023 7:40 AM 2 Days Post-Op  Subjective:  sitting up eating breakfast however says he is not very hungry. Remains somewhat confused but does respond appropriately. Was placed on Precedex overnight due to being combative   Vitals:   01/13/23 0600 01/13/23 0700  BP: (!) 111/58 120/74  Pulse: (!) 48 64  Resp: 15 15  Temp:    SpO2: 99% 91%   Physical Exam: Cardiac:  regular Lungs:  non labored Incisions:  Left femoral access dressings clean, dry and intact. Right femoral access site c/d/I without swelling or hematoma Extremities:  well perfused and warm with palpable DP pulses bilaterally Abdomen:  distended, soft, non tender Neurologic: alert, somewhat confused  CBC    Component Value Date/Time   WBC 11.1 (H) 01/12/2023 0453   RBC 3.28 (L) 01/12/2023 0453   HGB 9.1 (L) 01/12/2023 0453   HCT 27.4 (L) 01/12/2023 0453   PLT 111 (L) 01/12/2023 0453   MCV 83.5 01/12/2023 0453   MCH 27.7 01/12/2023 0453   MCHC 33.2 01/12/2023 0453   RDW 16.7 (H) 01/12/2023 0453   LYMPHSABS 1.1 09/06/2021 1019   MONOABS 0.5 09/06/2021 1019   EOSABS 0.2 09/06/2021 1019   BASOSABS 0.0 09/06/2021 1019    BMET    Component Value Date/Time   NA 133 (L) 01/12/2023 0453   K 4.9 01/12/2023 0453   CL 102 01/12/2023 0453   CO2 20 (L) 01/12/2023 0453   GLUCOSE 134 (H) 01/12/2023 0453   BUN 36 (H) 01/12/2023 0453   CREATININE 2.15 (H) 01/12/2023 0453   CALCIUM 8.0 (L) 01/12/2023 0453   GFRNONAA 30 (L) 01/12/2023 0453   GFRAA 44 (L) 01/31/2020 2310    INR    Component Value Date/Time   INR 1.7 (H) 01/11/2023 1645     Intake/Output Summary (Last 24 hours) at 01/13/2023 0740 Last data filed at 01/13/2023 0600 Gross per 24 hour  Intake 97.9 ml  Output 375 ml  Net -277.1 ml     Assessment/Plan:  82 y.o. male is s/p EVAR, Left IIA embolization and repair of the left external iliac artery for ruptured common iliac artery aneurysm  2 Days Post-Op   BLE well  perfused and warm with palpable DP pulses bilaterally Left groin incision is clean, dry and intact and well perfused Right common femoral access site without swelling or hematoma Remains somewhat confused Some hospital delirium/ baseline dementia/ sun downing  Feel he is okay to transfer out of ICU   Dory Horn [facility] [phone] 01/13/2023 7:40 AM  VASCULAR STAFF ADDENDUM: I have independently interviewed and examined the patient. I agree with the above.  Hospital delirium / dementia main issue now. Transfer to 4E. Anticipate DC in next 24h.  Rande Brunt. Lenell Antu, MD [facility] of [facility] Phone Number: [phone] 01/13/2023 10:59 AM

## 2023-01-13 NOTE — Telephone Encounter (Signed)
-----   Message from Leonie Douglas, MD sent at 01/11/2023  4:32 PM EDT ----- [name] 01/11/2023 Procedure:  1) bilateral ultrasound guided common femoral artery access  2) coil and plug embolization of left internal iliac artery 2) endovascular aortic and iliac aneurysm repair for rupture  3) extension of repair onto left external iliac artery 4) intravascular ultrasound of infrarenal aorta 5) open exposure and repair of left common femoral artery  Assistant: none Follow up: 4 weeks with me Studies for follow up: CT angiogram abdomen / pelvis  Thank you! Elijah Birk

## 2023-01-13 NOTE — Telephone Encounter (Signed)
lvm on daughter's phone to schedule post op appt per pt's spouse- the daughter will be the transportation

## 2023-01-13 NOTE — TOC Initial Note (Signed)
Transition of Care [facility]) - Initial/Assessment Note    Patient Details  Name: Jose Baker MRN: [id number] Date of Birth: March 18, 1941  [facility] CM/SW Contact:    Gala Lewandowsky, RN Phone Number: 01/13/2023, 12:11 PM  Clinical Narrative: Patient presented as a transfer from [facility] for ruptured left common iliac artery aneurysm. PTA patient was from home with spouse. Case Manager was able to speak with daughter Boyd Kerbs who is a Engineer, civil (consulting) and she states patient is a little more confused here than at home. Daughter and spouse take patient to appointments as needed. Boyd Kerbs states the patient has a cane in the home and that her brother lives close by and checks in on the patient. Case Manager will continue to follow for transition of care needs as the patient progresses.              Expected Discharge Plan: Home w Home Health Services Barriers to Discharge: Continued Medical Work up  Patient Goals and CMS Choice     Choice offered to / list presented to : NA    Expected Discharge Plan and Services   Discharge Planning Services: CM Consult   Living arrangements for the past 2 months: Single Family Home                 DME Arranged: N/A    Prior Living Arrangements/Services Living arrangements for the past 2 months: Single Family Home Lives with:: Spouse Patient language and need for interpreter reviewed:: Yes Do you feel safe going back to the place where you live?: Yes      Need for Family Participation in Patient Care: Yes (Comment) Care giver support system in place?: Yes (comment) Current home services: DME (cane) Criminal Activity/Legal Involvement Pertinent to Current Situation/Hospitalization: No - Comment as needed  Activities of Daily Living   ADL Screening (condition at time of admission) Patient's cognitive ability adequate to safely complete daily activities?: No Is the patient deaf or have difficulty hearing?: No Does the  patient have difficulty seeing, even when wearing glasses/contacts?: No Does the patient have difficulty concentrating, remembering, or making decisions?: Yes Patient able to express need for assistance with ADLs?: Yes Does the patient have difficulty dressing or bathing?: Yes Independently performs ADLs?: Yes (appropriate for developmental age) Does the patient have difficulty walking or climbing stairs?: No Weakness of Legs: None Weakness of Arms/Hands: None  Permission Sought/Granted Permission sought to share information with : Family Supports, Case Manager   Emotional Assessment Appearance:: Appears stated age       Alcohol / Substance Use: Not Applicable Psych Involvement: No (comment)  Admission diagnosis:  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] Patient Active Problem List   Diagnosis Date Noted   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) 01/11/2023   Group A streptococcal infection 09/02/2021   Streptococcal sepsis, unspecified (HCC) 09/02/2021   BPH (benign prostatic hyperplasia) 09/02/2021   Hypophosphatemia 08/29/2021   Left arm swelling 08/28/2021   Stage 3b chronic kidney disease (CKD) (HCC) 08/28/2021   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 [place] [place] [address] Phone: [phone] Fax: [phone]   Social Determinants of Health (SDOH) Social History: SDOH Screenings   Depression  (PHQ2-9): Low Risk  (09/06/2021)  Tobacco Use: Low Risk  (01/11/2023)   Readmission Risk Interventions    08/30/2021   12:33 PM  Readmission Risk Prevention Plan  Transportation Screening Complete  PCP or Specialist Appt within 3-5 Days Complete  Social Work Consult for Recovery Care Planning/Counseling Complete  Palliative Care Screening Not Applicable  Medication Review Oceanographer) Complete

## 2023-01-14 MED ORDER — ASPIRIN 81 MG PO TBEC: 81.0000 mg | DELAYED_RELEASE_TABLET | Freq: Every day | ORAL | 12 refills | Status: AC

## 2023-01-14 MED ORDER — OXYCODONE-ACETAMINOPHEN 5-325 MG PO TABS: 1.0000 | ORAL_TABLET | Freq: Four times a day (QID) | ORAL | 0 refills | Status: AC | PRN

## 2023-01-14 MED ORDER — APIXABAN 2.5 MG PO TABS
2.5000 mg | ORAL_TABLET | Freq: Two times a day (BID) | ORAL | Status: DC
  Administered 2023-01-14: 2.5 mg via ORAL
  Filled 2023-01-14: qty 1

## 2023-01-14 NOTE — Progress Notes (Signed)
Physical Therapy Evaluation Patient Details Name: Jose Baker MRN: [id number] DOB: Sep 10, 1940 Today's Date: 01/14/2023  History of Present Illness  82 y.o. male admitted 6/15, with contained ruptured left common iliac artery aneurysm.  PMH includes: Arrhythmia, Atrial fibrillation, CVA, Dementia, GERD, HTN, Hypercholesteremia.  Clinical Impression  Pt was seen for mobility including transfers and mobiltiy in confined spaces.  He is relying on the RW but can take steps without it as PLOF.  Will recommend MD consider if PT at home is needed, although strength is quite good but walker is unfamiliar.  Follow along with him to progress with safety and balance with all walker use and to instruct confined mobility and to talk with family if available.  Follow goals of PT outlined below.       Recommendations for follow up therapy are one component of a multi-disciplinary discharge planning process, led by the attending physician.  Recommendations may be updated based on patient status, additional functional criteria and insurance authorization.  Follow Up Recommendations       Assistance Recommended at Discharge Intermittent Supervision/Assistance  Patient can return home with the following  A little help with walking and/or transfers;A little help with bathing/dressing/bathroom;Assist for transportation;Help with stairs or ramp for entrance    Equipment Recommendations None recommended by PT  Recommendations for Other Services       Functional Status Assessment Patient has had a recent decline in their functional status and demonstrates the ability to make significant improvements in function in a reasonable and predictable amount of time.     Precautions / Restrictions Precautions Precautions: Fall Precaution Comments: new walker use Restrictions Weight Bearing Restrictions: No RLE Weight Bearing: Weight bearing as tolerated LLE Weight Bearing: Weight bearing as tolerated       Mobility  Bed Mobility               General bed mobility comments: up in chair when PT arrived    Transfers Overall transfer level: Needs assistance Equipment used: Rolling walker (2 wheels) Transfers: Sit to/from Stand Sit to Stand: Supervision   Step pivot transfers: Supervision            Ambulation/Gait Ambulation/Gait assistance: Min guard (for safety) Gait Distance (Feet): 45 Feet Assistive device: Rolling walker (2 wheels) Gait Pattern/deviations: Step-through pattern, Wide base of support, Decreased stride length Gait velocity: reduced Gait velocity interpretation: <1.31 ft/sec, indicative of household ambulator Pre-gait activities: standing balance ck General Gait Details: mild gait changes that facilitate his balance, walker is useful but not critical  Stairs            Wheelchair Mobility    Modified Rankin (Stroke Patients Only)       Balance Overall balance assessment: Needs assistance Sitting-balance support: Feet supported Sitting balance-Leahy Scale: Good     Standing balance support: Bilateral upper extremity supported, No upper extremity supported Standing balance-Leahy Scale: Fair                               Pertinent Vitals/Pain Pain Assessment Pain Assessment: Faces Faces Pain Scale: No hurt    Home Living Family/patient expects to be discharged to:: Private residence Living Arrangements: Spouse/significant other Available Help at Discharge: Family;Available 24 hours/day Type of Home: House Home Access: Stairs to enter   [employer] of Steps: 1 threshold   Home Layout: One level Home Equipment: Agricultural consultant (2 wheels);Cane - single point Additional Comments: walker is  unfamilar but will not recall instructions from PT    Prior Function Prior Level of Function : Needs assist  Cognitive Assist : ADLs (cognitive)   ADLs (Cognitive): Intermittent cues Physical Assist : Mobility  (physical) Mobility (physical): Gait   Mobility Comments: walking in the house no AD alone ADLs Comments: Spouse provides needed supervision, patient completes his own ADL, spouse assists with iADL and medication management.  No longer drives.     Hand Dominance   Dominant Hand: Right    Extremity/Trunk Assessment   Upper Extremity Assessment Upper Extremity Assessment: Defer to OT evaluation    Lower Extremity Assessment Lower Extremity Assessment: Overall WFL for tasks assessed    Cervical / Trunk Assessment Cervical / Trunk Assessment: Normal  Communication   Communication: No difficulties  Cognition Arousal/Alertness: Awake/alert Behavior During Therapy: Impulsive Overall Cognitive Status: History of cognitive impairments - at baseline                                 General Comments: impulsive about turning on walker to sit, tends to allow a longer space to walk backward        General Comments General comments (skin integrity, edema, etc.): Pt asked to use walker and can take steps with no  AD, likes walker support    Exercises     Assessment/Plan    PT Assessment Patient needs continued PT services  PT Problem List Decreased activity tolerance;Decreased balance;Decreased mobility;Decreased knowledge of use of DME       PT Treatment Interventions DME instruction;Gait training;Functional mobility training;Therapeutic activities;Therapeutic exercise;Balance training;Neuromuscular re-education;Patient/family education    PT Goals (Current goals can be found in the Care Plan section)  Acute Rehab PT Goals Patient Stated Goal: none stated PT Goal Formulation: Patient unable to participate in goal setting Time For Goal Achievement: 01/21/23 Potential to Achieve Goals: Good    Frequency Min 1X/week     Co-evaluation               AM-PAC PT "6 Clicks" Mobility  Outcome Measure Help needed turning from your back to your side while in a  flat bed without using bedrails?: None Help needed moving from lying on your back to sitting on the side of a flat bed without using bedrails?: A Little Help needed moving to and from a bed to a chair (including a wheelchair)?: A Little Help needed standing up from a chair using your arms (e.g., wheelchair or bedside chair)?: A Little Help needed to walk in hospital room?: A Little Help needed climbing 3-5 steps with a railing? : A Little 6 Click Score: 19    End of Session Equipment Utilized During Treatment: Gait belt Activity Tolerance: Patient tolerated treatment well Patient left: in chair;with call bell/phone within reach;with chair alarm set Nurse Communication: Mobility status PT Visit Diagnosis: Unsteadiness on feet (R26.81);Muscle weakness (generalized) (M62.81);Difficulty in walking, not elsewhere classified (R26.2)    Time: 6295-2841 PT Time Calculation (min) (ACUTE ONLY): 16 min   Charges:   PT Evaluation $PT Eval Moderate Complexity: 1 Mod         Ivar Drape 01/14/2023, 1:00 PM  Samul Dada, PT PhD Acute Rehab Dept. Number: [facility] [id number] and [facility] [phone]

## 2023-01-14 NOTE — Progress Notes (Addendum)
  Progress Note    01/14/2023 7:22 AM 3 Days Post-Op  Subjective:  asleep.  Agitated again overnight   Vitals:   01/13/23 2314 01/14/23 0307  BP: 136/75 131/68  Pulse: 82 76  Resp: 20 18  Temp: 98.8 F (37.1 C) 98.3 F (36.8 C)  SpO2: 93% 94%   Physical Exam: Asleep  CBC    Component Value Date/Time   WBC 9.3 01/13/2023 0831   RBC 3.33 (L) 01/13/2023 0831   HGB 8.9 (L) 01/13/2023 0831   HCT 28.5 (L) 01/13/2023 0831   PLT 144 (L) 01/13/2023 0831   MCV 85.6 01/13/2023 0831   MCH 26.7 01/13/2023 0831   MCHC 31.2 01/13/2023 0831   RDW 16.9 (H) 01/13/2023 0831   LYMPHSABS 1.1 09/06/2021 1019   MONOABS 0.5 09/06/2021 1019   EOSABS 0.2 09/06/2021 1019   BASOSABS 0.0 09/06/2021 1019    BMET    Component Value Date/Time   NA 138 01/13/2023 0831   K 4.6 01/13/2023 0831   CL 101 01/13/2023 0831   CO2 21 (L) 01/13/2023 0831   GLUCOSE 89 01/13/2023 0831   BUN 44 (H) 01/13/2023 0831   CREATININE 2.18 (H) 01/13/2023 0831   CALCIUM 8.8 (L) 01/13/2023 0831   GFRNONAA 29 (L) 01/13/2023 0831   GFRAA 44 (L) 01/31/2020 2310    INR    Component Value Date/Time   INR 1.7 (H) 01/11/2023 1645     Intake/Output Summary (Last 24 hours) at 01/14/2023 5621 Last data filed at 01/13/2023 2327 Gross per 24 hour  Intake 580 ml  Output 650 ml  Net -70 ml     Assessment/Plan:  82 y.o. male is s/p repair of ruptured L CIA aneurysm 3 Days Post-Op   Agitated again overnight Plan will be to have PT/OT evaluate then discharge home later today assuming he will not require any rehabilitation Office will arrange CTA abd pelvis in 1 month   Emilie Rutter, PA-C [facility] [phone] 01/14/2023 7:22 AM  VASCULAR STAFF ADDENDUM: I have independently interviewed and examined the patient. I agree with the above.  Delirium / sundowning. No other issues RE: aneurysm. PT/OT evaluation and dispo planning. Home today unless PT/OT recommends SNF/IPR Follow up  with me in 1 month with CT angiogram of abdomen / pelvis  Rande Brunt. Lenell Antu, MD [facility] Phone Number: [phone] 01/14/2023 7:29 AM

## 2023-01-14 NOTE — Progress Notes (Signed)
Pt restless w/ multiple IVs pulled out, purewicks removed, pulling at tele leads, several attempts to get out of bed. He is somewhat redirectable back to bed. Only alert to self. Discontinued telemetry for the night to minimize wires.

## 2023-01-14 NOTE — Progress Notes (Signed)
Discharge:  IV access and cardiac monitoring discontinued AVS reviewed with spouse including advancing activity, medications, appointments and how to care for incision sites.   Pt and spouse assisted by staff to private vehicle.

## 2023-01-14 NOTE — Evaluation (Signed)
Occupational Therapy Evaluation Patient Details Name: Jose Baker MRN: [id number] DOB: Apr 01, 1941 Today's Date: 01/14/2023   History of Present Illness 82 y.o. 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  PMH includes: Arrhythmia, Atrial fibrillation, CVA, Dementia, GERD, HTN, Hypercholesteremia.   Clinical Impression   Patient admitted for the diagnosis above.  PTA he lives with his spouse, who provides supportive assist.  Per spouse, patient completes his own ADL and does not use an AD for mobility.  Currently he is needing generalized supervision for ADL completion and mobility in the room.  He was able to use the RW at times, and walked without an AD at times.  In speaking with the spouse, she would prefer he return home, and they are not interested in [facility] rehab unless absolutely necessary.  OT will follow in the acute setting to address deficits, and no post acute OT is recommended.        Recommendations for follow up therapy are one component of a multi-disciplinary discharge planning process, led by the attending physician.  Recommendations may be updated based on patient status, additional functional criteria and insurance authorization.   Assistance Recommended at Discharge Frequent or constant Supervision/Assistance  Patient can return home with the following A little help with walking and/or transfers;A little help with bathing/dressing/bathroom;Assist for transportation;Assistance with cooking/housework;Direct supervision/assist for medications management    Functional Status Assessment  Patient has had a recent decline in their functional status and demonstrates the ability to make significant improvements in function in a reasonable and predictable amount of time.  Equipment Recommendations  None recommended by OT    Recommendations for Other Services       Precautions / Restrictions Precautions Precautions: Fall Restrictions Weight Bearing  Restrictions: No      Mobility Bed Mobility Overal bed mobility: Needs Assistance Bed Mobility: Supine to Sit     Supine to sit: Supervision          Transfers Overall transfer level: Needs assistance   Transfers: Sit to/from Stand, Bed to chair/wheelchair/BSC Sit to Stand: Supervision     Step pivot transfers: Supervision            Balance Overall balance assessment: Needs assistance Sitting-balance support: Feet supported Sitting balance-Leahy Scale: Good     Standing balance support: No upper extremity supported, Bilateral upper extremity supported Standing balance-Leahy Scale: Fair                             ADL either performed or assessed with clinical judgement   ADL                                         General ADL Comments: Generalized supervison for in room mobility/toileting and ADL completion.     Vision   Vision Assessment?: No apparent visual deficits     Perception     Praxis      Pertinent Vitals/Pain Pain Assessment Pain Assessment: No/denies pain     Hand Dominance Right   Extremity/Trunk Assessment Upper Extremity Assessment Upper Extremity Assessment: Overall WFL for tasks assessed   Lower Extremity Assessment Lower Extremity Assessment: Defer to PT evaluation   Cervical / Trunk Assessment Cervical / Trunk Assessment: Normal   Communication Communication Communication: No difficulties   Cognition Arousal/Alertness: Awake/alert Behavior During Therapy: WFL for tasks assessed/performed Overall  Cognitive Status: History of cognitive impairments - at baseline                                       General Comments   Mild SOB with ADL    Exercises     Shoulder Instructions      Home Living Family/patient expects to be discharged to:: Private residence Living Arrangements: Spouse/significant other Available Help at Discharge: Family;Available 24 hours/day Type of  Home: House Home Access: Stairs to enter [employer] of Steps: 1 threshold   Home Layout: One level     Bathroom Shower/Tub: Producer, television/film/video: Standard Bathroom Accessibility: Yes How Accessible: Accessible via walker Home Equipment: Rolling Walker (2 wheels);Cane - single point          Prior Functioning/Environment Prior Level of Function : Needs assist  Cognitive Assist : ADLs (cognitive)   ADLs (Cognitive): Intermittent cues       Mobility Comments: Per spouse patient walks household distances without AD ADLs Comments: Spouse provides needed supervision, patient completes his own ADL, spouse assists with iADL and medication management.  No longer drives.        OT Problem List: Decreased activity tolerance;Impaired balance (sitting and/or standing)      OT Treatment/Interventions: Self-care/ADL training;Therapeutic activities;Patient/family education;Balance training    OT Goals(Current goals can be found in the care plan section) Acute Rehab OT Goals Patient Stated Goal: None stated OT Goal Formulation: Patient unable to participate in goal setting Time For Goal Achievement: 01/28/23 Potential to Achieve Goals: Good ADL Goals Pt Will Perform Grooming: with modified independence;standing Pt Will Perform Lower Body Dressing: with modified independence;sit to/from stand Pt Will Transfer to Toilet: with modified independence;ambulating;regular height toilet  OT Frequency: Min 2X/week    Co-evaluation              AM-PAC OT "6 Clicks" Daily Activity     Outcome Measure Help from another person eating meals?: None Help from another person taking care of personal grooming?: None Help from another person toileting, which includes using toliet, bedpan, or urinal?: A Little Help from another person bathing (including washing, rinsing, drying)?: A Little Help from another person to put on and taking off regular upper body clothing?:  None Help from another person to put on and taking off regular lower body clothing?: A Little 6 Click Score: 21   End of Session Equipment Utilized During Treatment: Rolling walker (2 wheels) Nurse Communication: Mobility status  Activity Tolerance: Patient tolerated treatment well Patient left: in chair;with call bell/phone within reach;with chair alarm set  OT Visit Diagnosis: Unsteadiness on feet (R26.81)                Time: 1610-9604 OT Time Calculation (min): 25 min Charges:  OT General Charges $OT Visit: 1 Visit OT Evaluation $OT Eval Moderate Complexity: 1 Mod OT Treatments $Self Care/Home Management : 8-22 mins  01/14/2023  RP, OTR/L  Acute Rehabilitation Services  Office:  [phone]   Suzanna Obey 01/14/2023, 9:40 AM

## 2023-01-14 NOTE — TOC Transition Note (Signed)
Transition of Care (TOC) - CM/SW Discharge Note Donn Pierini RN, BSN Transitions of Care Unit 4E- RN Case Manager See Treatment Team for direct phone #   Patient Details  Name: Jose Baker MRN: [id number] Date of Birth: 12/27/1940  [facility]) CM/SW Contact:  Darrold Span, RN Phone Number: 01/14/2023, 11:43 AM   Clinical Narrative:    Pt stable for transition home today with family. PT/OT have completed evals this am and no follow up recommendations made- pt safe to return home with family with no new needs identified.   Family to transport home RNCM will sign off for now as intervention is no longer needed. Please re-consult  if new needs arise, or contact RNCM assigned to treatment team for further questions/concerns.     Final next level of care: Home/Self Care Barriers to Discharge: Barriers Resolved   Patient Goals and CMS Choice   Choice offered to / list presented to : NA  Discharge Placement                 Home        Discharge Plan and Services Additional resources added to the After Visit Summary for     Discharge Planning Services: CM Consult Post Acute Care Choice: NA          DME Arranged: N/A DME Agency: NA       HH Arranged: NA HH Agency: NA        Social Determinants of Health (SDOH) Interventions SDOH Screenings   Depression (PHQ2-9): Low Risk  (09/06/2021)  Tobacco Use: Low Risk  (01/11/2023)     Readmission Risk Interventions    01/14/2023   11:43 AM 08/30/2021   12:33 PM  Readmission Risk Prevention Plan  Transportation Screening Complete Complete  PCP or Specialist Appt within 5-7 Days Complete   PCP or Specialist Appt within 3-5 Days  Complete  Home Care Screening Complete   Medication Review (RN CM) Complete   Social Work Consult for Recovery Care Planning/Counseling  Complete  Palliative Care Screening  Not Applicable  Medication Review [employer])  Complete

## 2023-01-14 NOTE — Discharge Instructions (Addendum)
[facility]   Discharge Instructions  Endovascular Aortic Aneurysm Repair  Please refer to the following instructions for your post-procedure care. Your surgeon or Physician Assistant will discuss any changes with you.  Activity  You are encouraged to walk as much as you can. You can slowly return to normal activities but must avoid strenuous activity and heavy lifting until your doctor tells you it's OK. Avoid activities such as vacuuming or swinging a gold club. It is normal to feel tired for several weeks after your surgery. Do not drive until your doctor gives the OK and you are no longer taking prescription pain medications. It is also normal to have difficulty with sleep habits, eating, and bowel movements after surgery. These will go away with time.  Bathing/Showering  You may shower after you go home. If you have an incision, do not soak in a bathtub, hot tub, or swim until the incision heals completely.  Incision Care  Shower every day. Clean your incision with mild soap and water. Pat the area dry with a clean towel. You do not need a bandage unless otherwise instructed. Do not apply any ointments or creams to your incision. If you clothing is irritating, you may cover your incision with a dry gauze pad.  Diet  Resume your normal diet. There are no special food restrictions following this procedure. A low fat/low cholesterol diet is recommended for all patients with vascular disease. In order to heal from your surgery, it is CRITICAL to get adequate nutrition. Your body requires vitamins, minerals, and protein. Vegetables are the best source of vitamins and minerals. Vegetables also provide the perfect balance of protein. Processed food has little nutritional value, so try to avoid this.  Medications  Resume taking all of your medications unless your doctor or nurse practitioner tells you not to. If your incision is causing pain, you may take  over-the-counter pain relievers such as acetaminophen (Tylenol). If you were prescribed a stronger pain medication, please be aware these medications can cause nausea and constipation. Prevent nausea by taking the medication with a snack or meal. Avoid constipation by drinking plenty of fluids and eating foods with a high amount of fiber, such as fruits, vegetables, and grains. Do not take Tylenol if you are taking prescription pain medications.   Follow up  Our office will schedule a follow-up appointment with a C.T. scan 3-4 weeks after your surgery.  Please call us immediately for any of the following conditions  Severe or worsening pain in your legs or feet or in your abdomen back or chest. Increased pain, redness, drainage (pus) from your incision sit. Increased abdominal pain, bloating, nausea, vomiting or persistent diarrhea. Fever of 101 degrees or higher. Swelling in your leg (s),  Reduce your risk of vascular disease  Stop smoking. If you would like help call [facility] at 1-800-QUIT-NOW ((646)508-6865) or  at [phone]. Manage your cholesterol Maintain a desired weight Control your diabetes Keep your blood pressure down  If you have questions, please call the office at [phone].   Information on my medicine - ELIQUIS (apixaban)  This medication education was reviewed with me or my healthcare representative as part of my discharge preparation.  The pharmacist that spoke with me during my hospital stay was:    Why was Eliquis prescribed for you? Eliquis was prescribed for you to reduce the risk of a blood clot forming that can cause a stroke if you have a medical condition  called atrial fibrillation (a type of irregular heartbeat).  What do You need to know about Eliquis ? Take your Eliquis TWICE DAILY - one tablet in the morning and one tablet in the evening with or without food. If you have difficulty swallowing the tablet whole please discuss with  your pharmacist how to take the medication safely.  Take Eliquis exactly as prescribed by your doctor and DO NOT stop taking Eliquis without talking to the doctor who prescribed the medication.  Stopping may increase your risk of developing a stroke.  Refill your prescription before you run out.  After discharge, you should have regular check-up appointments with your healthcare provider that is prescribing your Eliquis.  In the future your dose may need to be changed if your kidney function or weight changes by a significant amount or as you get older.  What do you do if you miss a dose? If you miss a dose, take it as soon as you remember on the same day and resume taking twice daily.  Do not take more than one dose of ELIQUIS at the same time to make up a missed dose.  Important Safety Information A possible side effect of Eliquis is bleeding. You should call your healthcare provider right away if you experience any of the following: Bleeding from an injury or your nose that does not stop. Unusual colored urine (red or dark brown) or unusual colored stools (red or black). Unusual bruising for unknown reasons. A serious fall or if you hit your head (even if there is no bleeding).  Some medicines may interact with Eliquis and might increase your risk of bleeding or clotting while on Eliquis. To help avoid this, consult your healthcare provider or pharmacist prior to using any new prescription or non-prescription medications, including herbals, vitamins, non-steroidal anti-inflammatory drugs (NSAIDs) and supplements.  This website has more information on Eliquis (apixaban): http://www.eliquis.com/eliquis/home

## 2023-01-14 NOTE — Care Management Important Message (Signed)
Important Message  Patient Details  Name: Jose Baker MRN: [id number] Date of Birth: Dec 24, 1940   Medicare Important Message Given:  Yes     Dorena Bodo 01/14/2023, 3:21 PM

## 2023-01-14 NOTE — Telephone Encounter (Signed)
Appt has been scheduled.

## 2023-01-14 NOTE — Progress Notes (Addendum)
AVS meds times filled in on AVS. Pt's primary nurse is reaching out to pt's wife to let her know he is discharging home and ask what time she may be arriving to pick him up. AVS printed, await family to review due to pt's memory.   Cal Gindlesperger,RN SWOT

## 2023-01-15 LAB — TYPE AND SCREEN
ABO/RH(D): AB POS
Antibody Screen: NEGATIVE
Unit division: 0
Unit division: 0
Unit division: 0

## 2023-01-15 LAB — BPAM RBC
Blood Product Expiration Date: 202407022359
Blood Product Expiration Date: 202407022359
ISSUE DATE / TIME: 202406151552
ISSUE DATE / TIME: 202406151552

## 2023-01-15 NOTE — Discharge Summary (Signed)
[facility] Discharge Summary   Jose Baker 1940-09-22 82 y.o. male  MRN: [id number]  Admission Date: 01/11/2023  Discharge Date: 01/14/23  Physician: Dr. 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  He presented to the [facility] emergency department and was found to have a ruptured left common iliac artery aneurysm.  He was transferred to [facility] for surgical repair.  He was brought emergently to the operating room on 01/11/2023 and underwent coil embolization of the left internal iliac artery with endovascular aortic and iliac aneurysm repair of rupture with open exposure and repair of the left common femoral artery by Dr. Lenell Antu.  He tolerated the procedure well and was admitted to the ICU postoperatively.  He remained in the ICU for about 48 hours to monitor hemodynamics and fluid status.  He eventually was transferred out of the ICU to the floor.  Bilateral lower extremities are warm and well-perfused throughout the hospital stay.  At the time of discharge left groin incision was well-appearing.  He was prescribed narcotic pain medication for continued postoperative pain control.  He did experience some sundowning overnight however was alert and oriented in the daytime.  He will follow-up in the office in about 1 month with a CTA abdomen and pelvis.  He was discharged home in stable condition.  CBC    Component Value Date/Time   WBC 9.3 01/13/2023 0831   RBC 3.33 (L) 01/13/2023 0831   HGB 8.9 (L) 01/13/2023 0831   HCT 28.5 (L) 01/13/2023 0831   PLT 144 (L) 01/13/2023 0831   MCV 85.6 01/13/2023 0831   MCH 26.7 01/13/2023 0831   MCHC 31.2 01/13/2023 0831   RDW 16.9 (H) 01/13/2023 0831   LYMPHSABS 1.1 09/06/2021 1019   MONOABS 0.5 09/06/2021 1019   EOSABS 0.2 09/06/2021 1019    BASOSABS 0.0 09/06/2021 1019    BMET    Component Value Date/Time   NA 138 01/13/2023 0831   K 4.6 01/13/2023 0831   CL 101 01/13/2023 0831   CO2 21 (L) 01/13/2023 0831   GLUCOSE 89 01/13/2023 0831   BUN 44 (H) 01/13/2023 0831   CREATININE 2.18 (H) 01/13/2023 0831   CALCIUM 8.8 (L) 01/13/2023 0831   GFRNONAA 29 (L) 01/13/2023 0831   GFRAA 44 (L) 01/31/2020 2310         Discharge Diagnosis:  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]  Secondary Diagnosis: Patient Active Problem List   Diagnosis Date Noted   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) 01/11/2023   Group A streptococcal infection 09/02/2021   Streptococcal sepsis, unspecified (HCC) 09/02/2021   BPH (benign prostatic hyperplasia) 09/02/2021   Hypophosphatemia 08/29/2021   Left arm swelling 08/28/2021   Stage 3b chronic kidney disease (CKD) (HCC) 08/28/2021   Nonrheumatic aortic valve insufficiency 09/01/2019   Benign prostatic hyperplasia without lower urinary tract symptoms 12/04/2017   CKD (chronic kidney disease) stage 3, GFR 30-59 ml/min (HCC) 12/04/2017   Paroxysmal atrial fibrillation (HCC) 08/13/2016   Arthritis 08/12/2016   OSA (obstructive sleep apnea) 08/12/2016   Cerebral aneurysm without rupture 04/26/2016   GERD (gastroesophageal reflux disease) 03/07/2014   Hyperlipidemia 03/07/2014   Hypertension 03/07/2014   Past Medical History:  Diagnosis Date   Arrhythmia    Atrial fibrillation (HCC)  CVA (cerebral vascular accident) (HCC)    Dementia (HCC)    GERD without esophagitis    HTN (hypertension)    Hypercholesteremia    Obesity    Sleep apnea      Allergies as of 01/14/2023       Reactions   Lipitor [atorvastatin Calcium]         Medication List     TAKE these medications    aspirin EC 81 MG tablet Take 1 tablet (81 mg total) by mouth daily at 6 (six) AM. Swallow whole.   Eliquis 2.5 MG Tabs tablet Generic drug: apixaban Take 2.5 mg by mouth 2  (two) times daily.   febuxostat 40 MG tablet Commonly known as: ULORIC Take 40 mg by mouth daily.   finasteride 5 MG tablet Commonly known as: PROSCAR Take 1 tablet (5 mg total) by mouth daily.   lisinopril 10 MG tablet Commonly known as: ZESTRIL Take 10 mg by mouth daily. Found this in chart review from [facility].   melatonin 3 MG Tabs tablet Take 10 mg by mouth at bedtime.   metoprolol succinate 100 MG 24 hr tablet Commonly known as: TOPROL-XL Take 100 mg by mouth daily.   omeprazole 20 MG capsule Commonly known as: PRILOSEC 40 mg daily.   oxyCODONE-acetaminophen 5-325 MG tablet Commonly known as: PERCOCET/ROXICET Take 1 tablet by mouth every 6 (six) hours as needed for moderate pain.   pravastatin 40 MG tablet Commonly known as: PRAVACHOL Take 40 mg by mouth daily.        Discharge Instructions:   [facility]  Discharge Instructions Endovascular Aortic Aneurysm Repair  Please refer to the following instructions for your post-procedure care. Your surgeon or Physician Assistant will discuss any changes with you.  Activity  You are encouraged to walk as much as you can. You can slowly return to normal activities but must avoid strenuous activity and heavy lifting until your doctor tells you it's OK. Avoid activities such as vacuuming or swinging a gold club. It is normal to feel tired for several weeks after your surgery. Do not drive until your doctor gives the OK and you are no longer taking prescription pain medications. It is also normal to have difficulty with sleep habits, eating, and bowel movements after surgery. These will go away with time.  Bathing/Showering  You may shower after you go home. If you have an incision, do not soak in a bathtub, hot tub, or swim until the incision heals completely.  Incision Care  Shower every day. Clean your incision with mild soap and water. Pat the area dry with a clean towel. You do  not need a bandage unless otherwise instructed. Do not apply any ointments or creams to your incision. If you clothing is irritating, you may cover your incision with a dry gauze pad.  Diet  Resume your normal diet. There are no special food restrictions following this procedure. A low fat/low cholesterol diet is recommended for all patients with vascular disease. In order to heal from your surgery, it is CRITICAL to get adequate nutrition. Your body requires vitamins, minerals, and protein. Vegetables are the best source of vitamins and minerals. Vegetables also provide the perfect balance of protein. Processed food has little nutritional value, so try to avoid this.  Medications  Resume taking all of your medications unless your doctor or Physician Assistnat tells you not to. If your incision is causing pain, you may take over-the-counter pain relievers  such as acetaminophen (Tylenol). If you were prescribed a stronger pain medication, please be aware these medications can cause nausea and constipation. Prevent nausea by taking the medication with a snack or meal. Avoid constipation by drinking plenty of fluids and eating foods with a high amount of fiber, such as fruits, vegetables, and grains. Do not take Tylenol if you are taking prescription pain medications.   Follow up  Our office will schedule a follow-up appointment with a C.T. scan 3-4 weeks after your surgery.  Please call us immediately for any of the following conditions  Severe or worsening pain in your legs or feet or in your abdomen back or chest. Increased pain, redness, drainage (pus) from your incision sit. Increased abdominal pain, bloating, nausea, vomiting or persistent diarrhea. Fever of 101 degrees or higher. Swelling in your leg (s),  Reduce your risk of vascular disease  Stop smoking. If you would like help call [facility] at 1-800-QUIT-NOW ((404)679-4618) or [place] at (214)[phone]. Manage your  cholesterol Maintain a desired weight Control your diabetes Keep your blood pressure down  If you have questions, please call the office at [phone].    Disposition: home  Patient's condition: is Fair  Follow up: 1. Dr. Lenell Antu in 4 weeks with CTA protocol   Emilie Rutter, PA-C [facility] [phone] 01/15/2023  11:35 AM   - For VQI Registry use - Post-op:  Time to Extubation: [x]  In OR, [ ]  < 12 hrs, [ ]  12-24 hrs, [ ]  >=24 hrs Vasopressors Req. Post-op: Yes MI: No., [ ]  Troponin only, [ ]  EKG or Clinical New Arrhythmia: No CHF: No ICU Stay: 2 days Transfusion: No     Complications: Resp failure: No., [ ]  Pneumonia, [ ]  Ventilator Chg in renal function: No., [ ]  Inc. Cr > 0.5, [ ]  Temp. Dialysis,  [ ]  Permanent dialysis Leg ischemia: No., no Surgery needed, [ ]  Yes, Surgery needed,  [ ]  Amputation Bowel ischemia: No., [ ]  Medical Rx, [ ]  Surgical Rx Wound complication: No., [ ]  Superficial separation/infection, [ ]  Return to OR Return to OR: No  Return to OR for bleeding: No Stroke: No., [ ]  Minor, [ ]  Major  Discharge medications: Statin use:  Yes  ASA use:  Yes  Plavix use:  No  Beta blocker use:  Yes  ARB use:  No ACEI use:  Yes CCB use:  No

## 2023-01-16 LAB — CULTURE, BLOOD (ROUTINE X 2)
Culture: NO GROWTH
Culture: NO GROWTH

## 2023-01-17 ENCOUNTER — Other Ambulatory Visit: Payer: Self-pay

## 2023-01-17 DIAGNOSIS — I723 Aneurysm of iliac artery: Secondary | ICD-10-CM

## 2023-01-23 ENCOUNTER — Encounter (HOSPITAL_COMMUNITY): Payer: Self-pay | Admitting: Vascular Surgery

## 2023-02-03 NOTE — Addendum Note (Signed)
Addended by: Leilani Able, Farhana Fellows A on: 02/03/2023 02:17 PM   Modules accepted: Orders

## 2023-02-06 ENCOUNTER — Ambulatory Visit
Admission: RE | Admit: 2023-02-06 | Discharge: 2023-02-06 | Disposition: A | Discharge status: Home / Self Care | Payer: Medicare HMO | Source: Ambulatory Visit | Attending: Vascular Surgery | Admitting: Vascular Surgery

## 2023-02-06 DIAGNOSIS — I723 Aneurysm of iliac artery: Secondary | ICD-10-CM | POA: Diagnosis not present

## 2023-02-06 DIAGNOSIS — K449 Diaphragmatic hernia without obstruction or gangrene: Secondary | ICD-10-CM | POA: Diagnosis not present

## 2023-02-06 DIAGNOSIS — K5730 Diverticulosis of large intestine without perforation or abscess without bleeding: Secondary | ICD-10-CM | POA: Diagnosis not present

## 2023-02-06 DIAGNOSIS — N281 Cyst of kidney, acquired: Secondary | ICD-10-CM | POA: Diagnosis not present

## 2023-02-06 MED ORDER — IOHEXOL 350 MG/ML SOLN
75.0000 mL | Freq: Once | INTRAVENOUS | Status: AC | PRN
  Administered 2023-02-06: 75 mL via INTRAVENOUS

## 2023-02-10 NOTE — Progress Notes (Unsigned)
[facility] [place]  ASSESSMENT / PLAN: 82 y.o. 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 He has no hemodynamic compromise and is mentally alert. He has had pain for several days and now has scrotal ecchymosis. I suspect this has been ruptured for some time. He is safe to transfer. We will plan to repair this at [facility]. I counseled the patient and his family about the risky nature of this diagnosis. He has risk of death, MI, stroke, renal failure, etc. Even with succcessful repair. They are understanding and wish to proceed urgently.   CHIEF COMPLAINT: ***  HISTORY OF PRESENT ILLNESS: Jose Baker is a 82 y.o. male who presents to [facility] emergency department for evaluation of scrotal bruising.  Patient is mildly demented and fairly stoic.  His wife noticed some bruising about his scrotum today and brought him to be evaluated.  Over the past several days the patient has noted lower back pain which has not disabling.  He and his wife figured he had just hurt his back and thought nothing more of it.  CT scan performed in the ER today shows ruptured left common iliac artery aneurysm that appears contained on noncontrast scan.  His hemodynamics are reassuring.  He is awake and alert.   02/11/23: ***  VASCULAR SURGICAL HISTORY: ***  VASCULAR RISK FACTORS: {FINDINGS; POSITIVE NEGATIVE:[id number]} history of stroke / transient ischemic attack. {FINDINGS; POSITIVE NEGATIVE:[id number]} history of coronary artery disease. *** history of PCI. *** history of CABG.  {FINDINGS; POSITIVE NEGATIVE:[id number]} history of diabetes mellitus. Last A1c ***. {FINDINGS; POSITIVE NEGATIVE:[id number]} history of smoking. *** actively smoking. {FINDINGS; POSITIVE NEGATIVE:[id number]} history of hypertension. *** drug regimen with *** control. {FINDINGS; POSITIVE NEGATIVE:[id number]} history of chronic kidney disease.  Last GFR ***. CKD  {stage:30421363}. {FINDINGS; POSITIVE NEGATIVE:[phone]} history of chronic obstructive pulmonary disease, treated with ***.  FUNCTIONAL STATUS: ECOG performance status: {findings; ecog performance status:31780} Ambulatory status: {TNHAmbulation:25868}  CAREY 1 AND 3 YEAR INDEX Male (2pts) 75-79 or 80-84 (2pts) >84 (3pts) Dependence in toileting (1pt) Partial or full dependence in dressing (1pt) History of malignant neoplasm (2pts) CHF (3pts) COPD (1pts) CKD (3pts)  0-3 pts 6% 1 year mortality ; 21% 3 year mortality 4-5 pts 12% 1 year mortality ; 36% 3 year mortality >5 pts 21% 1 year mortality; 54% 3 year mortality   Past Medical History:  Diagnosis Date   Arrhythmia    Atrial fibrillation (HCC)    CVA (cerebral vascular accident) (HCC)    Dementia (HCC)    GERD without esophagitis    HTN (hypertension)    Hypercholesteremia    Obesity    Sleep apnea     Past Surgical History:  Procedure Laterality Date   ABDOMINAL AORTIC ENDOVASCULAR STENT GRAFT  01/11/2023   Procedure: ABDOMINAL AORTIC ENDOVASCULAR STENT GRAFT;  Surgeon: Leonie Douglas, MD;  Location: [facility] OR;  Service: Vascular;;   ANEURYSM COILING N/A 01/11/2023   Procedure: LEFT INTERNAL ILIAC ANEURYSM COILING;  Surgeon: Leonie Douglas, MD;  Location: [facility] OR;  Service: Vascular;  Laterality: N/A;   BRAIN SURGERY     CATARACT EXTRACTION     INCISION AND DRAINAGE ABSCESS Left 08/29/2021   Procedure: INCISION AND DRAINAGE ABSCESS of Left ARM;  Surgeon: Campbell Lerner, MD;  Location: [facility] ORS;  Service: General;  Laterality: Left;  Pt not NPO, but emergency status precludes NPO status.   PERCUTANEOUS VENOUS THROMBECTOMY,LYSIS WITH INTRAVASCULAR ULTRASOUND (IVUS) N/A 01/11/2023   Procedure: INTRAVASCULAR ULTRASOUND (  IVUS);  Surgeon: Leonie Douglas, MD;  Location: [facility] OR;  Service: Vascular;  Laterality: N/A;   ULTRASOUND GUIDANCE FOR VASCULAR ACCESS Bilateral 01/11/2023   Procedure: ULTRASOUND GUIDANCE FOR VASCULAR  ACCESS;  Surgeon: Leonie Douglas, MD;  Location: [facility] OR;  Service: Vascular;  Laterality: Bilateral;    Family History  Problem Relation Age of Onset   Alzheimer's disease Other    Hypertension Other    Prostate cancer Neg Hx    Bladder Cancer Neg Hx    Kidney cancer Neg Hx     Social History   Socioeconomic History   Marital status: Unknown    Spouse name: Not on file   Number of children: Not on file   Years of education: Not on file   Highest education level: Not on file  Occupational History   Occupation: dairy farmer  Tobacco Use   Smoking status: Never   Smokeless tobacco: Never  Substance and Sexual Activity   Alcohol use: No   Drug use: No   Sexual activity: Not on file  Other Topics Concern   Not on file  Social History Narrative   Not on file   Social Determinants of Health   Financial Resource Strain: Not on file  Food Insecurity: Not on file  Transportation Needs: Not on file  Physical Activity: Not on file  Stress: Not on file  Social Connections: Not on file  Intimate Partner Violence: Not on file    Allergies  Allergen Reactions   Lipitor [Atorvastatin Calcium]     Current Outpatient Medications  Medication Sig Dispense Refill   aspirin EC 81 MG tablet Take 1 tablet (81 mg total) by mouth daily at 6 (six) AM. Swallow whole. 30 tablet 12   ELIQUIS 2.5 MG TABS tablet Take 2.5 mg by mouth 2 (two) times daily.     febuxostat (ULORIC) 40 MG tablet Take 40 mg by mouth daily.     finasteride (PROSCAR) 5 MG tablet Take 1 tablet (5 mg total) by mouth daily. 30 tablet 11   lisinopril (ZESTRIL) 10 MG tablet Take 10 mg by mouth daily. Found this in chart review from [facility].     Melatonin 3 MG TABS Take 10 mg by mouth at bedtime.     metoprolol succinate (TOPROL-XL) 100 MG 24 hr tablet Take 100 mg by mouth daily.  1   omeprazole (PRILOSEC) 20 MG capsule 40 mg daily.     oxyCODONE-acetaminophen (PERCOCET/ROXICET) 5-325 MG tablet Take 1 tablet by  mouth every 6 (six) hours as needed for moderate pain. 15 tablet 0   pravastatin (PRAVACHOL) 40 MG tablet Take 40 mg by mouth daily.     No current facility-administered medications for this visit.    PHYSICAL EXAM There were no vitals filed for this visit.  Constitutional: *** appearing. *** distress. Appears *** nourished.  Neurologic: CN ***. *** focal findings. *** sensory loss. Psychiatric: *** Mood and affect symmetric and appropriate. Eyes: *** No icterus. No conjunctival pallor. Ears, nose, throat: *** mucous membranes moist. Midline trachea.  Cardiac: *** rate and rhythm.  Respiratory: *** unlabored. Abdominal: *** soft, non-tender, non-distended.  Peripheral vascular: *** Extremity: *** edema. *** cyanosis. *** pallor.  Skin: *** gangrene. *** ulceration.  Lymphatic: *** Stemmer's sign. *** palpable lymphadenopathy.    PERTINENT LABORATORY AND RADIOLOGIC DATA  Most recent CBC    Latest Ref Rng & Units 01/13/2023    8:31 AM 01/12/2023    4:53 AM 01/11/2023  4:45 PM  CBC  WBC 4.0 - 10.5 K/uL 9.3  11.1  10.5   Hemoglobin 13.0 - 17.0 g/dL 8.9  9.1  8.6   Hematocrit 39.0 - 52.0 % 28.5  27.4  27.7   Platelets 150 - 400 K/uL 144  111  129      Most recent CMP    Latest Ref Rng & Units 01/13/2023    8:31 AM 01/12/2023    4:53 AM 01/11/2023    4:45 PM  CMP  Glucose 70 - 99 mg/dL 89  161  096   BUN 8 - 23 mg/dL 44  36  29   Creatinine 0.61 - 1.24 mg/dL 0.45  4.09  8.11   Sodium 135 - 145 mmol/L 138  133  138   Potassium 3.5 - 5.1 mmol/L 4.6  4.9  4.4   Chloride 98 - 111 mmol/L 101  102  107   CO2 22 - 32 mmol/L 21  20  21    Calcium 8.9 - 10.3 mg/dL 8.8  8.0  7.7    CT angiogram of abdomen pelvis personally reviewed.  Good technical result from stent grafting.  No evidence of endoleak.  Interval progression of aneurysm sac to 47 mm from 56 mm.  There is a fluid collection in the left groin likely postoperative seroma.  Rande Brunt. Lenell Antu, MD [facility] Phone Number: [phone] 02/10/2023 12:10 PM   Total time spent on preparing this encounter including chart review, data review, collecting history, examining the patient, coordinating care for this {tnhtimebilling:26202}  Portions of this report may have been transcribed using voice recognition software.  Every effort has been made to ensure accuracy; however, inadvertent computerized transcription errors may still be present.

## 2023-02-11 ENCOUNTER — Ambulatory Visit: Payer: Medicare HMO | Admitting: Vascular Surgery

## 2023-02-11 ENCOUNTER — Encounter: Payer: Self-pay | Admitting: Vascular Surgery

## 2023-02-11 VITALS — BP 171/81 | HR 64 | Temp 97.7°F | Resp 20 | Ht 62.0 in | Wt 206.0 lb

## 2023-02-11 DIAGNOSIS — I723 Aneurysm of iliac artery: Secondary | ICD-10-CM

## 2023-02-19 ENCOUNTER — Other Ambulatory Visit: Payer: Self-pay

## 2023-02-19 DIAGNOSIS — I723 Aneurysm of iliac artery: Secondary | ICD-10-CM

## 2023-03-05 DIAGNOSIS — E538 Deficiency of other specified B group vitamins: Secondary | ICD-10-CM | POA: Diagnosis not present

## 2023-03-05 DIAGNOSIS — D649 Anemia, unspecified: Secondary | ICD-10-CM | POA: Diagnosis not present

## 2023-06-04 IMAGING — RF DG UGI W/ HIGH DENSITY W/O KUB
12 of 15 series · 14 of 24 positions shown · non-contrast
Comparison: CT abdomen/pelvis 04/14/2020

CLINICAL DATA: Nausea, vomiting, epigastric pain

EXAM:
UPPER GI SERIES WITHOUT KUB
TECHNIQUE: Routine upper GI series was performed with thin/high density/water
soluble barium.
FLUOROSCOPY TIME:  Fluoroscopy Time:  4.2 minute
Radiation Exposure Index (if provided by the fluoroscopic device):
82.6 mGy
Number of Acquired Spot Images: 0

[Series 1: cp_standard · 0.26mm/px · 1 of 1 slices shown (1 of 7)]
[im 1/1]
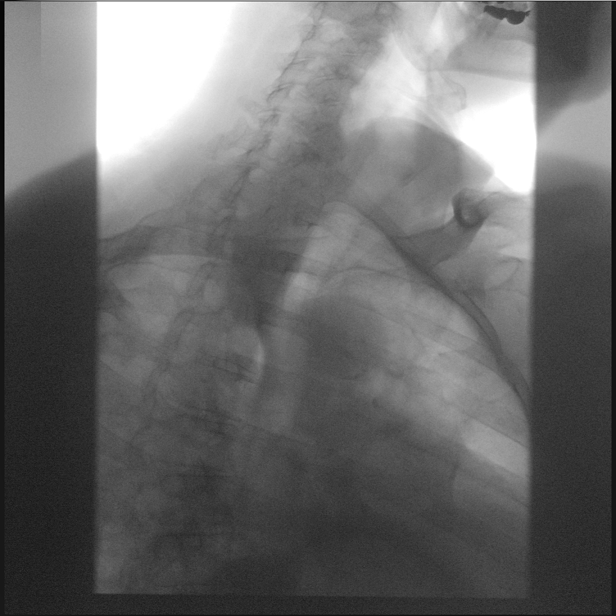

[Series 2: cp_standard · 0.26mm/px · 1 of 158 frames shown (2 of 7)]
[frame 80/158]
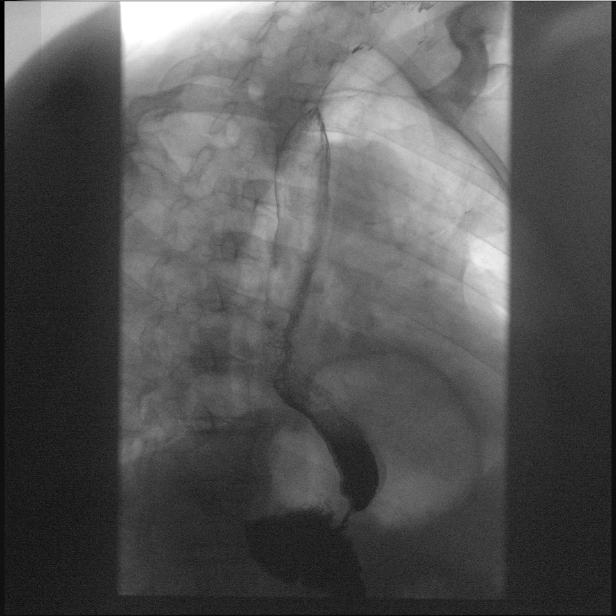

[Series 3: cp_standard · 0.26mm/px · 1 of 181 frames shown (3 of 7)]
[frame 91/181]
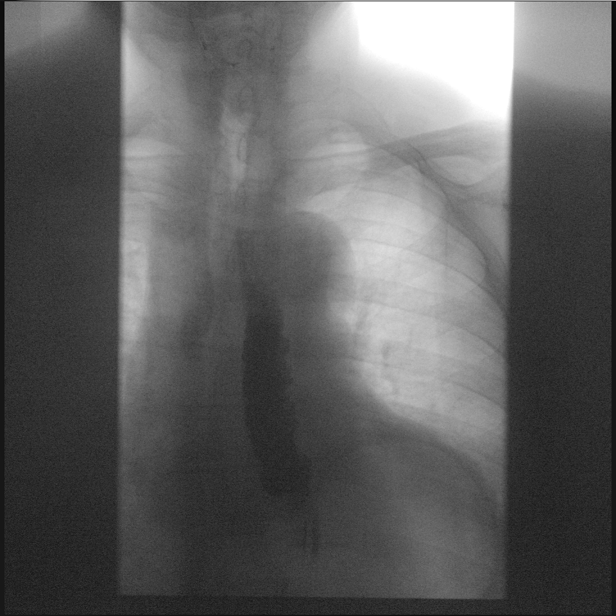

[Series 4: cp_standard · 0.26mm/px · 2 of 115 frames shown (4 of 7)]
[frame 21/115]
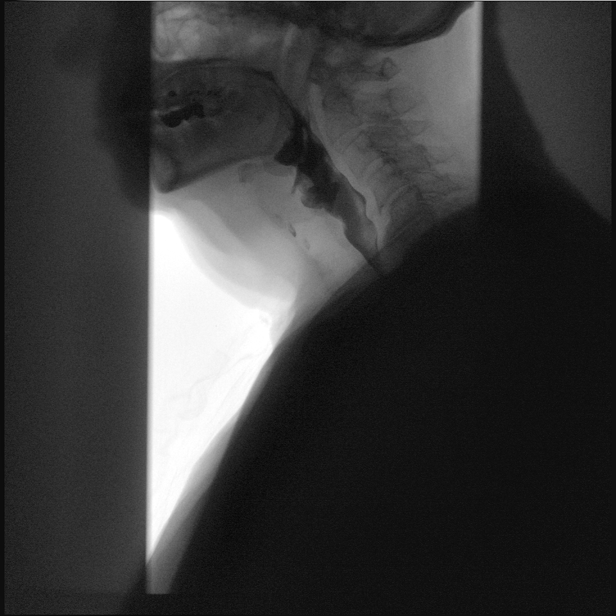
[frame 98/115]
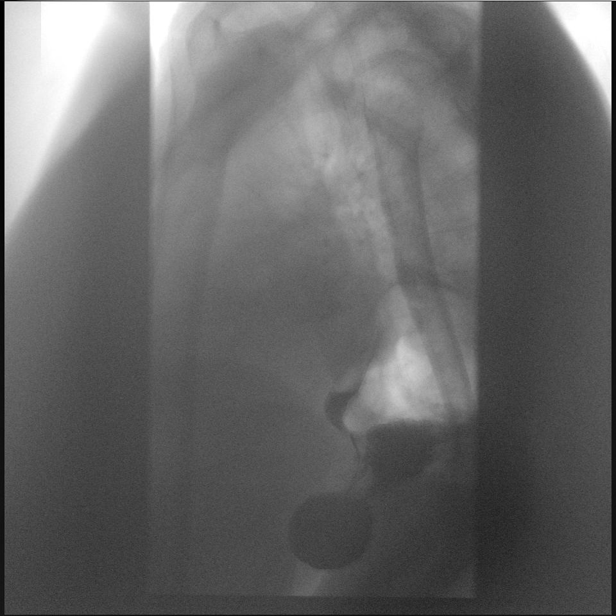

[Series 5: cp_standard · 0.27mm/px · 1 of 72 frames shown (5 of 7)]
[frame 37/72]
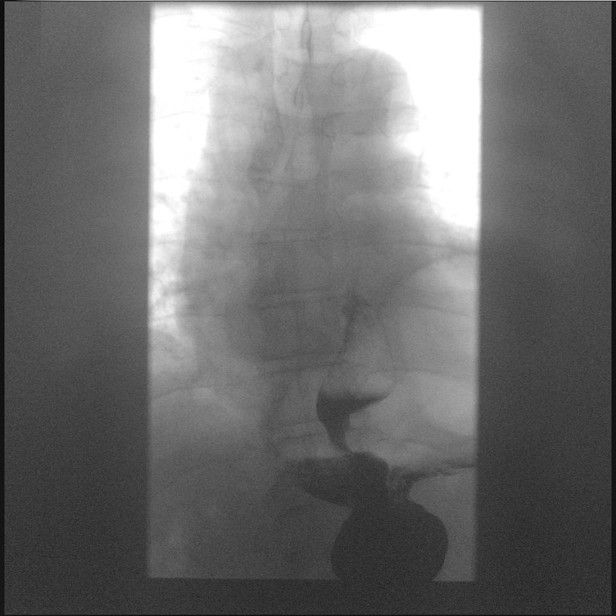

[Series 6: cp_standard · 0.27mm/px · 2 of 127 frames shown (6 of 7)]
[frame 64/127]
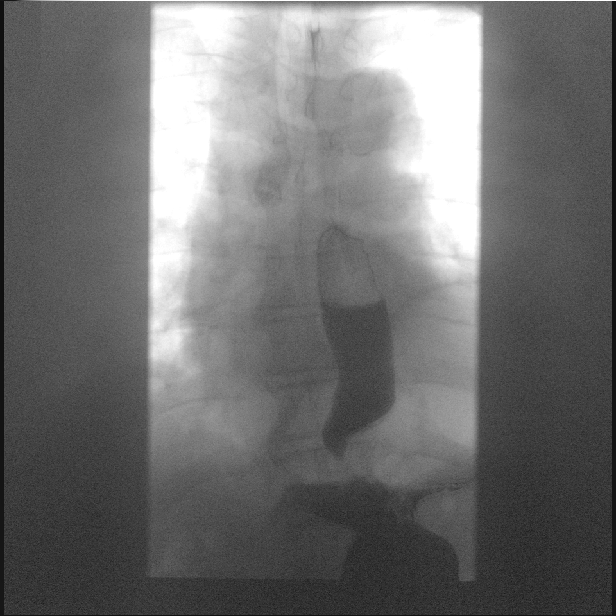
[frame 108/127]
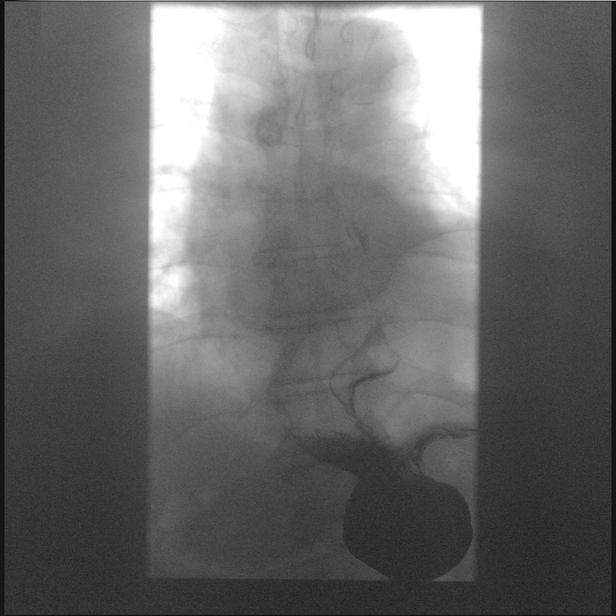

[Series 10: fluoro_barium 2fps_bw · 0.18mm/px · 1 of 1 slices shown (1 of 5)]
[im 1/1]
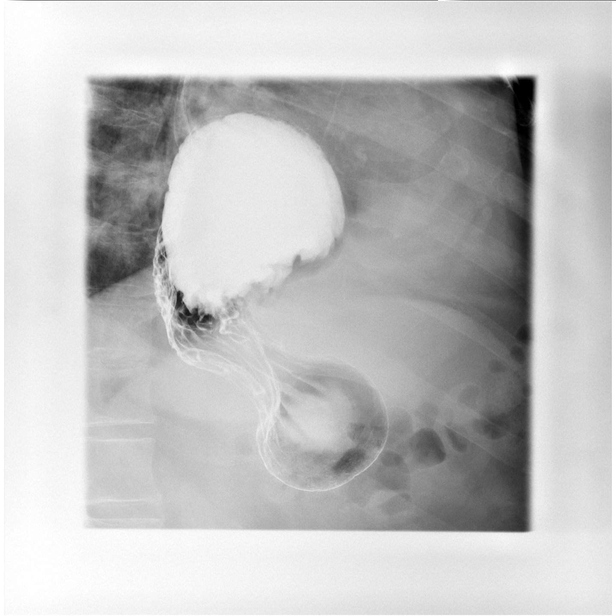

[Series 13: cp_standard · 0.26mm/px · 1 of 46 frames shown (7 of 7)]
[frame 7/46]
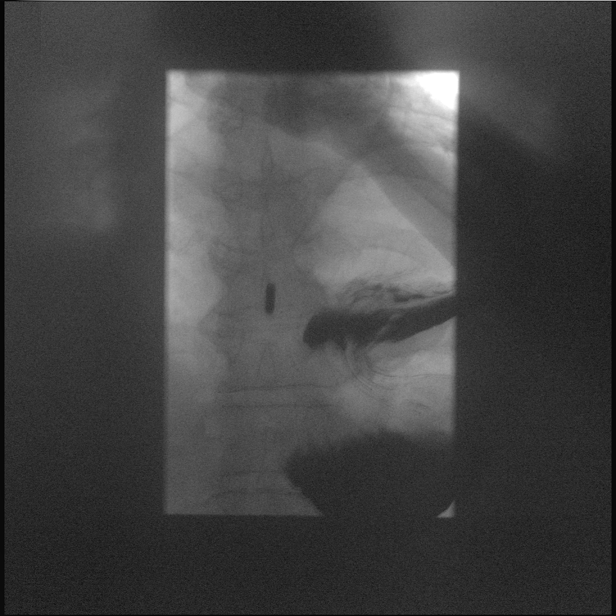

[Series 14: fluoro_barium 2fps_bw · 0.17mm/px · 1 of 1 slices shown (2 of 5)]
[im 1/1]
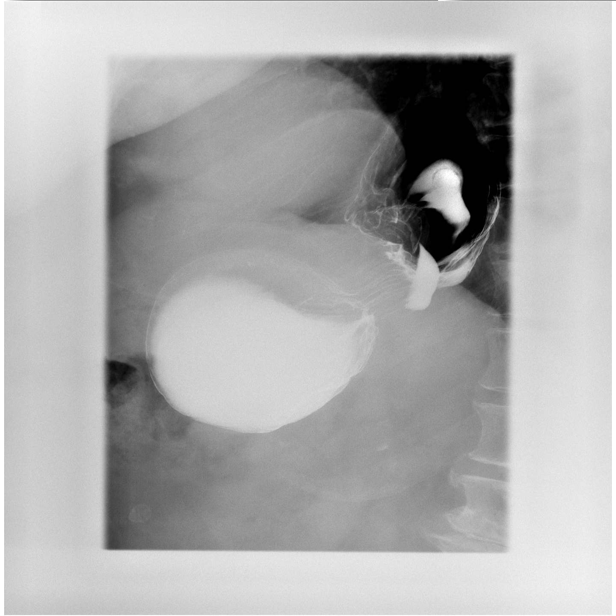

[Series 15: fluoro_barium 2fps_bw · 0.17mm/px · 1 of 2 frames shown (3 of 5)]
[frame 1/2]
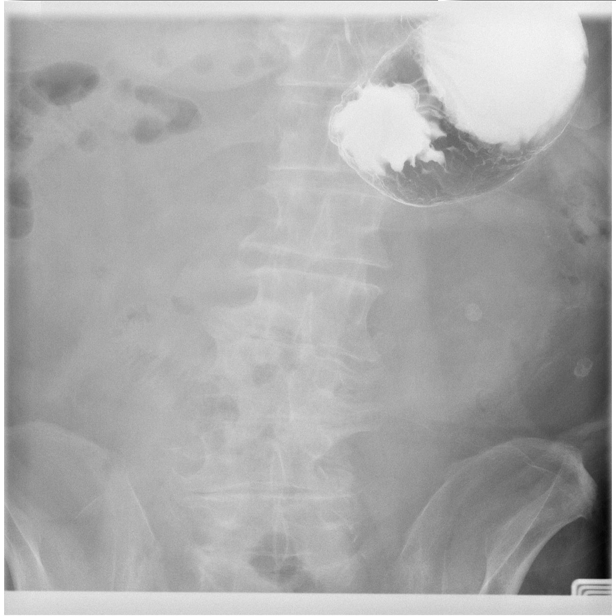

[Series 19: fluoro_barium 2fps_bw · 0.17mm/px · 1 of 2 frames shown (4 of 5)]
[frame 1/2]
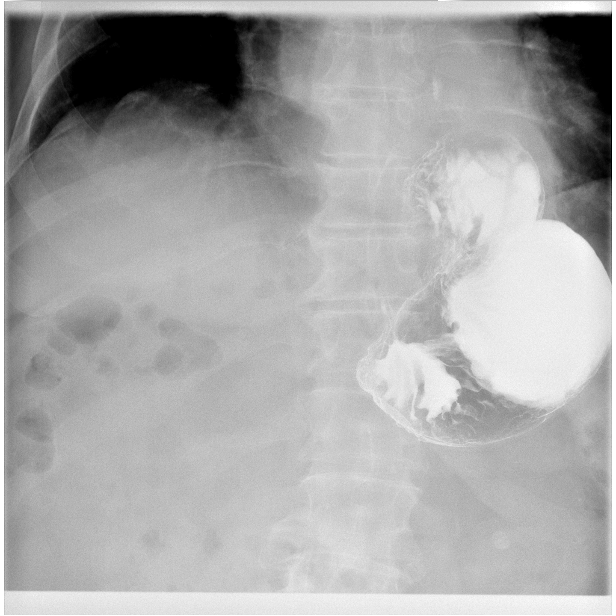

[Series 20: fluoro_barium 2fps_bw · 0.17mm/px · 1 of 2 frames shown (5 of 5)]
[frame 2/2]
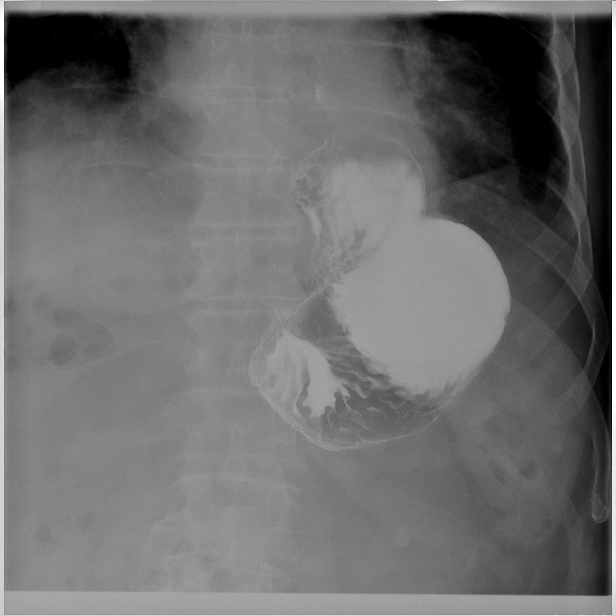

[14 of 24 positions shown; findings below may reference images not displayed]

FINDINGS: UPPER GI SERIES:

Tertiary contractions of the esophagus as can be seen with
esophageal spasm. Normal esophageal morphology without evidence of
esophagitis or ulceration. No esophageal stricture, diverticula, or
mass lesion. Large hiatal hernia. No spontaneous or inducible
gastroesophageal reflux.

Examination of the stomach demonstrated normal rugal folds and areae
gastricae. Gastric mucosa appeared unremarkable without evidence of
ulceration, scarring, or mass lesion. No emptying of contrast from
the stomach into the proximal small bowel 30 minutes after
administration of the initial contrast bolus.

At the end of the examination a 13 mm barium tablet was administered
which transited through the esophagus and esophagogastric junction
without delay.
IMPRESSION: 1. No significant contrast emptying from the stomach into the
proximal small bowel after 30 minutes of observation. Overall
findings are concerning for gastric outlet obstruction.
2. Tertiary contractions of the esophagus as can be seen with
esophageal spasm.
3. Large hiatal hernia.

## 2023-10-30 DIAGNOSIS — M1A9XX1 Chronic gout, unspecified, with tophus (tophi): Secondary | ICD-10-CM | POA: Diagnosis not present

## 2023-10-30 DIAGNOSIS — N400 Enlarged prostate without lower urinary tract symptoms: Secondary | ICD-10-CM | POA: Diagnosis not present

## 2023-10-30 DIAGNOSIS — I129 Hypertensive chronic kidney disease with stage 1 through stage 4 chronic kidney disease, or unspecified chronic kidney disease: Secondary | ICD-10-CM | POA: Diagnosis not present

## 2023-10-30 DIAGNOSIS — K219 Gastro-esophageal reflux disease without esophagitis: Secondary | ICD-10-CM | POA: Diagnosis not present

## 2023-10-30 DIAGNOSIS — G4733 Obstructive sleep apnea (adult) (pediatric): Secondary | ICD-10-CM | POA: Diagnosis not present

## 2023-10-30 DIAGNOSIS — N1832 Chronic kidney disease, stage 3b: Secondary | ICD-10-CM | POA: Diagnosis not present

## 2023-10-30 DIAGNOSIS — M129 Arthropathy, unspecified: Secondary | ICD-10-CM | POA: Diagnosis not present

## 2023-10-30 DIAGNOSIS — I679 Cerebrovascular disease, unspecified: Secondary | ICD-10-CM | POA: Diagnosis not present

## 2023-10-30 DIAGNOSIS — E782 Mixed hyperlipidemia: Secondary | ICD-10-CM | POA: Diagnosis not present

## 2023-10-30 DIAGNOSIS — R413 Other amnesia: Secondary | ICD-10-CM | POA: Diagnosis not present

## 2023-10-30 DIAGNOSIS — I480 Paroxysmal atrial fibrillation: Secondary | ICD-10-CM | POA: Diagnosis not present

## 2023-11-19 DIAGNOSIS — R413 Other amnesia: Secondary | ICD-10-CM | POA: Diagnosis not present

## 2023-11-19 DIAGNOSIS — E782 Mixed hyperlipidemia: Secondary | ICD-10-CM | POA: Diagnosis not present

## 2023-12-03 DIAGNOSIS — J069 Acute upper respiratory infection, unspecified: Secondary | ICD-10-CM | POA: Diagnosis not present

## 2023-12-03 DIAGNOSIS — R051 Acute cough: Secondary | ICD-10-CM | POA: Diagnosis not present

## 2023-12-03 DIAGNOSIS — R0981 Nasal congestion: Secondary | ICD-10-CM | POA: Diagnosis not present

## 2023-12-03 DIAGNOSIS — R0982 Postnasal drip: Secondary | ICD-10-CM | POA: Diagnosis not present
